# Patient Record
Sex: Male | Born: 1935
Health system: Southern US, Community
[De-identification: ages and names within clinical notes are randomized; demographics above are authoritative.]

## PROBLEM LIST (undated history)

## (undated) DIAGNOSIS — K219 Gastro-esophageal reflux disease without esophagitis: Secondary | ICD-10-CM

## (undated) DIAGNOSIS — M1990 Unspecified osteoarthritis, unspecified site: Secondary | ICD-10-CM

## (undated) DIAGNOSIS — R001 Bradycardia, unspecified: Secondary | ICD-10-CM

## (undated) HISTORY — PX: CATARACT EXTRACTION: SUR2

## (undated) HISTORY — PX: POLYPECTOMY: SHX149

## (undated) HISTORY — PX: OTHER SURGICAL HISTORY: SHX169

## (undated) HISTORY — DX: Unspecified osteoarthritis, unspecified site: M19.90

## (undated) HISTORY — PX: COLONOSCOPY: SHX174

## (undated) HISTORY — DX: Gastro-esophageal reflux disease without esophagitis: K21.9

---

## 2004-04-16 ENCOUNTER — Ambulatory Visit: Payer: Self-pay | Admitting: Gastroenterology

## 2004-05-01 ENCOUNTER — Ambulatory Visit: Payer: Self-pay | Admitting: Gastroenterology

## 2011-01-28 DIAGNOSIS — I471 Supraventricular tachycardia: Secondary | ICD-10-CM

## 2011-01-28 DIAGNOSIS — I4719 Other supraventricular tachycardia: Secondary | ICD-10-CM

## 2011-01-28 HISTORY — DX: Other supraventricular tachycardia: I47.19

## 2011-01-28 HISTORY — DX: Supraventricular tachycardia: I47.1

## 2011-02-10 DIAGNOSIS — H26499 Other secondary cataract, unspecified eye: Secondary | ICD-10-CM | POA: Diagnosis not present

## 2011-03-11 DIAGNOSIS — L821 Other seborrheic keratosis: Secondary | ICD-10-CM | POA: Diagnosis not present

## 2011-03-11 DIAGNOSIS — D239 Other benign neoplasm of skin, unspecified: Secondary | ICD-10-CM | POA: Diagnosis not present

## 2011-03-11 DIAGNOSIS — L570 Actinic keratosis: Secondary | ICD-10-CM | POA: Diagnosis not present

## 2011-04-17 DIAGNOSIS — M5030 Other cervical disc degeneration, unspecified cervical region: Secondary | ICD-10-CM | POA: Diagnosis not present

## 2011-04-17 DIAGNOSIS — M542 Cervicalgia: Secondary | ICD-10-CM | POA: Diagnosis not present

## 2011-04-18 DIAGNOSIS — M5030 Other cervical disc degeneration, unspecified cervical region: Secondary | ICD-10-CM | POA: Diagnosis not present

## 2011-04-18 DIAGNOSIS — M542 Cervicalgia: Secondary | ICD-10-CM | POA: Diagnosis not present

## 2011-04-18 DIAGNOSIS — M1990 Unspecified osteoarthritis, unspecified site: Secondary | ICD-10-CM | POA: Diagnosis not present

## 2011-04-22 DIAGNOSIS — M1990 Unspecified osteoarthritis, unspecified site: Secondary | ICD-10-CM | POA: Diagnosis not present

## 2011-04-22 DIAGNOSIS — M542 Cervicalgia: Secondary | ICD-10-CM | POA: Diagnosis not present

## 2011-04-22 DIAGNOSIS — M5030 Other cervical disc degeneration, unspecified cervical region: Secondary | ICD-10-CM | POA: Diagnosis not present

## 2011-04-24 DIAGNOSIS — M542 Cervicalgia: Secondary | ICD-10-CM | POA: Diagnosis not present

## 2011-04-24 DIAGNOSIS — M1990 Unspecified osteoarthritis, unspecified site: Secondary | ICD-10-CM | POA: Diagnosis not present

## 2011-04-24 DIAGNOSIS — L259 Unspecified contact dermatitis, unspecified cause: Secondary | ICD-10-CM | POA: Diagnosis not present

## 2011-04-24 DIAGNOSIS — M5030 Other cervical disc degeneration, unspecified cervical region: Secondary | ICD-10-CM | POA: Diagnosis not present

## 2011-04-29 DIAGNOSIS — M5030 Other cervical disc degeneration, unspecified cervical region: Secondary | ICD-10-CM | POA: Diagnosis not present

## 2011-04-29 DIAGNOSIS — M542 Cervicalgia: Secondary | ICD-10-CM | POA: Diagnosis not present

## 2011-04-29 DIAGNOSIS — M1990 Unspecified osteoarthritis, unspecified site: Secondary | ICD-10-CM | POA: Diagnosis not present

## 2011-05-01 DIAGNOSIS — M5030 Other cervical disc degeneration, unspecified cervical region: Secondary | ICD-10-CM | POA: Diagnosis not present

## 2011-05-01 DIAGNOSIS — M542 Cervicalgia: Secondary | ICD-10-CM | POA: Diagnosis not present

## 2011-05-01 DIAGNOSIS — M1990 Unspecified osteoarthritis, unspecified site: Secondary | ICD-10-CM | POA: Diagnosis not present

## 2011-05-06 DIAGNOSIS — M1990 Unspecified osteoarthritis, unspecified site: Secondary | ICD-10-CM | POA: Diagnosis not present

## 2011-05-06 DIAGNOSIS — M5030 Other cervical disc degeneration, unspecified cervical region: Secondary | ICD-10-CM | POA: Diagnosis not present

## 2011-05-06 DIAGNOSIS — M542 Cervicalgia: Secondary | ICD-10-CM | POA: Diagnosis not present

## 2011-05-08 DIAGNOSIS — M542 Cervicalgia: Secondary | ICD-10-CM | POA: Diagnosis not present

## 2011-05-08 DIAGNOSIS — M1990 Unspecified osteoarthritis, unspecified site: Secondary | ICD-10-CM | POA: Diagnosis not present

## 2011-05-08 DIAGNOSIS — M5030 Other cervical disc degeneration, unspecified cervical region: Secondary | ICD-10-CM | POA: Diagnosis not present

## 2011-05-14 DIAGNOSIS — H903 Sensorineural hearing loss, bilateral: Secondary | ICD-10-CM | POA: Diagnosis not present

## 2011-05-14 DIAGNOSIS — H9319 Tinnitus, unspecified ear: Secondary | ICD-10-CM | POA: Diagnosis not present

## 2011-06-24 DIAGNOSIS — Z125 Encounter for screening for malignant neoplasm of prostate: Secondary | ICD-10-CM | POA: Diagnosis not present

## 2011-06-24 DIAGNOSIS — R7309 Other abnormal glucose: Secondary | ICD-10-CM | POA: Diagnosis not present

## 2011-06-24 DIAGNOSIS — E785 Hyperlipidemia, unspecified: Secondary | ICD-10-CM | POA: Diagnosis not present

## 2011-06-27 DIAGNOSIS — N183 Chronic kidney disease, stage 3 unspecified: Secondary | ICD-10-CM | POA: Diagnosis not present

## 2011-06-27 DIAGNOSIS — Z0000 Encounter for general adult medical examination without abnormal findings: Secondary | ICD-10-CM | POA: Diagnosis not present

## 2011-06-27 DIAGNOSIS — M47812 Spondylosis without myelopathy or radiculopathy, cervical region: Secondary | ICD-10-CM | POA: Diagnosis not present

## 2011-06-27 DIAGNOSIS — E785 Hyperlipidemia, unspecified: Secondary | ICD-10-CM | POA: Diagnosis not present

## 2011-06-27 DIAGNOSIS — Z125 Encounter for screening for malignant neoplasm of prostate: Secondary | ICD-10-CM | POA: Diagnosis not present

## 2011-07-02 DIAGNOSIS — Z1212 Encounter for screening for malignant neoplasm of rectum: Secondary | ICD-10-CM | POA: Diagnosis not present

## 2011-07-14 ENCOUNTER — Encounter: Payer: Self-pay | Admitting: Gastroenterology

## 2011-08-06 ENCOUNTER — Ambulatory Visit (AMBULATORY_SURGERY_CENTER): Payer: Medicare Other | Admitting: *Deleted

## 2011-08-06 VITALS — Ht 70.0 in | Wt 175.0 lb

## 2011-08-06 DIAGNOSIS — Z86010 Personal history of colonic polyps: Secondary | ICD-10-CM

## 2011-08-06 DIAGNOSIS — Z1211 Encounter for screening for malignant neoplasm of colon: Secondary | ICD-10-CM

## 2011-08-06 MED ORDER — PEG-KCL-NACL-NASULF-NA ASC-C 100 G PO SOLR: ORAL | Status: DC

## 2011-08-06 NOTE — Progress Notes (Signed)
No allergy to eggs or soy products 

## 2011-08-20 ENCOUNTER — Encounter: Payer: Self-pay | Admitting: Gastroenterology

## 2011-08-20 ENCOUNTER — Ambulatory Visit (AMBULATORY_SURGERY_CENTER): Payer: Medicare Other | Admitting: Gastroenterology

## 2011-08-20 VITALS — BP 136/110 | HR 61 | Temp 96.3°F | Resp 18 | Ht 70.0 in | Wt 175.0 lb

## 2011-08-20 DIAGNOSIS — Z1211 Encounter for screening for malignant neoplasm of colon: Secondary | ICD-10-CM

## 2011-08-20 DIAGNOSIS — Z86010 Personal history of colonic polyps: Secondary | ICD-10-CM

## 2011-08-20 DIAGNOSIS — K219 Gastro-esophageal reflux disease without esophagitis: Secondary | ICD-10-CM | POA: Diagnosis not present

## 2011-08-20 MED ORDER — SODIUM CHLORIDE 0.9 % IV SOLN: 500.0000 mL | INTRAVENOUS | Status: DC

## 2011-08-20 NOTE — Patient Instructions (Addendum)
YOU HAD AN ENDOSCOPIC PROCEDURE TODAY AT THE [facility]: Refer to the procedure report that was given to you for any specific questions about what was found during the examination.  If the procedure report does not answer your questions, please call your gastroenterologist to clarify.  If you requested that your care partner not be given the details of your procedure findings, then the procedure report has been included in a sealed envelope for you to review at your convenience later.  YOU SHOULD EXPECT: Some feelings of bloating in the abdomen. Passage of more gas than usual.  Walking can help get rid of the air that was put into your GI tract during the procedure and reduce the bloating. If you had a lower endoscopy (such as a colonoscopy or flexible sigmoidoscopy) you may notice spotting of blood in your stool or on the toilet paper. If you underwent a bowel prep for your procedure, then you may not have a normal bowel movement for a few days.  DIET: Your first meal following the procedure should be a light meal and then it is ok to progress to your normal diet.  A half-sandwich or bowl of soup is an example of a good first meal.  Heavy or fried foods are harder to digest and may make you feel nauseous or bloated.  Likewise meals heavy in dairy and vegetables can cause extra gas to form and this can also increase the bloating.  Drink plenty of fluids but you should avoid alcoholic beverages for 24 hours.  ACTIVITY: Your care partner should take you home directly after the procedure.  You should plan to take it easy, moving slowly for the rest of the day.  You can resume normal activity the day after the procedure however you should NOT DRIVE or use heavy machinery for 24 hours (because of the sedation medicines used during the test).    SYMPTOMS TO REPORT IMMEDIATELY: A gastroenterologist can be reached at any hour.  During normal business hours, 8:30 AM to 5:00 PM Monday through Friday,  call [phone].  After hours and on weekends, please call the GI answering service at [phone] who will take a message and have the physician on call contact you.   Following lower endoscopy (colonoscopy or flexible sigmoidoscopy):  Excessive amounts of blood in the stool  Significant tenderness or worsening of abdominal pains  Swelling of the abdomen that is new, acute  Fever of 100F or higher  Following upper endoscopy (EGD)  Vomiting of blood or coffee ground material  New chest pain or pain under the shoulder blades  Painful or persistently difficult swallowing  New shortness of breath  Fever of 100F or higher  Black, tarry-looking stools  FOLLOW UP: If any biopsies were taken you will be contacted by phone or by letter within the next 1-3 weeks.  Call your gastroenterologist if you have not heard about the biopsies in 3 weeks.  Our staff will call the home number listed on your records the next business day following your procedure to check on you and address any questions or concerns that you may have at that time regarding the information given to you following your procedure. This is a courtesy call and so if there is no answer at the home number and we have not heard from you through the emergency physician on call, we will assume that you have returned to your regular daily activities without incident.  SIGNATURES/CONFIDENTIALITY: You and/or your care   partner have signed paperwork which will be entered into your electronic medical record.  These signatures attest to the fact that that the information above on your After Visit Summary has been reviewed and is understood.  Full responsibility of the confidentiality of this discharge information lies with you and/or your care-partner.  

## 2011-08-20 NOTE — Progress Notes (Signed)
Patient did not experience any of the following events: a burn prior to discharge; a fall within the facility; wrong site/side/patient/procedure/implant event; or a hospital transfer or hospital admission upon discharge from the facility. (G8907) Patient did not have preoperative order for IV antibiotic SSI prophylaxis. (G8918)  

## 2011-08-20 NOTE — Op Note (Signed)
[facility] [address] [place], [place]  [address]  COLONOSCOPY PROCEDURE REPORT  PATIENT:  Jaime Gates, Jaime Gates  MR#:  [id number] BIRTHDATE:  08/16/1931, 79 yrs. old  GENDER:  male ENDOSCOPIST:  Vania Rea. Jarold Motto, MD, [facility] REF. BY: PROCEDURE DATE:  08/20/2011 PROCEDURE:  Diagnostic Colonoscopy ASA CLASS:  Class II INDICATIONS:  history of pre-cancerous (adenomatous) colon polyps  MEDICATIONS:   propofol (Diprivan) 250 mg IV  DESCRIPTION OF PROCEDURE:   After the risks and benefits and of the procedure were explained, informed consent was obtained. Digital rectal exam was performed and revealed no abnormalities. The LB CF-[id number] endoscope was introduced through the anus and advanced to the cecum, which was identified by both the appendix and ileocecal valve.  The quality of the prep was poor, using MoviPrep.  The instrument was then slowly withdrawn as the colon was fully examined. <<PROCEDUREIMAGES>>  FINDINGS:  No polyps or cancers were seen.  This was otherwise a normal examination of the colon. tortuous colon and poor prep !!! Retroflexed views in the rectum revealed no abnormalities.    The scope was then withdrawn from the patient and the procedure completed.  COMPLICATIONS:  None ENDOSCOPIC IMPRESSION: 1) No polyps or cancers 2) Otherwise normal examination RECOMMENDATIONS: 1) Continue current medications f/u prn only.  REPEAT EXAM:  No  ______________________________ Vania Rea. Jarold Motto, MD, [facility]  CC:  Jarome Matin, MD  n. Rosalie Doctor:   Vania Rea. Patterson at 08/20/2011 09:52 AM  Howard Pouch, [phone]

## 2011-08-21 ENCOUNTER — Telehealth: Payer: Self-pay

## 2011-08-21 NOTE — Telephone Encounter (Signed)
  Follow up Call-  Call back number 08/20/2011  Post procedure Call Back phone  # [phone]  Permission to leave phone message Yes     Patient questions:  Do you have a fever, pain , or abdominal swelling? no Pain Score  0 *  Have you tolerated food without any problems? yes  Have you been able to return to your normal activities? yes  Do you have any questions about your discharge instructions: Diet   no Medications  no Follow up visit  no  Do you have questions or concerns about your Care? no  Actions: * If pain score is 4 or above: No action needed, pain <4.  Per the pt, "I did good all the way around". Maw

## 2011-12-05 ENCOUNTER — Encounter: Payer: Self-pay | Admitting: Cardiovascular Disease

## 2011-12-05 DIAGNOSIS — R0602 Shortness of breath: Secondary | ICD-10-CM | POA: Diagnosis not present

## 2011-12-05 DIAGNOSIS — M47812 Spondylosis without myelopathy or radiculopathy, cervical region: Secondary | ICD-10-CM | POA: Diagnosis not present

## 2011-12-05 DIAGNOSIS — R42 Dizziness and giddiness: Secondary | ICD-10-CM | POA: Diagnosis not present

## 2011-12-08 ENCOUNTER — Encounter: Payer: Self-pay | Admitting: Cardiovascular Disease

## 2011-12-08 ENCOUNTER — Encounter: Payer: Self-pay | Admitting: *Deleted

## 2011-12-08 DIAGNOSIS — K219 Gastro-esophageal reflux disease without esophagitis: Secondary | ICD-10-CM | POA: Insufficient documentation

## 2011-12-08 DIAGNOSIS — M1990 Unspecified osteoarthritis, unspecified site: Secondary | ICD-10-CM | POA: Insufficient documentation

## 2011-12-10 ENCOUNTER — Ambulatory Visit (INDEPENDENT_AMBULATORY_CARE_PROVIDER_SITE_OTHER): Payer: Medicare Other | Admitting: Cardiovascular Disease

## 2011-12-10 ENCOUNTER — Encounter: Payer: Self-pay | Admitting: Cardiovascular Disease

## 2011-12-10 VITALS — BP 131/74 | HR 71 | Resp 18 | Ht 70.0 in | Wt 179.0 lb

## 2011-12-10 DIAGNOSIS — R0989 Other specified symptoms and signs involving the circulatory and respiratory systems: Secondary | ICD-10-CM

## 2011-12-10 DIAGNOSIS — R0609 Other forms of dyspnea: Secondary | ICD-10-CM | POA: Diagnosis not present

## 2011-12-10 DIAGNOSIS — R0602 Shortness of breath: Secondary | ICD-10-CM | POA: Diagnosis not present

## 2011-12-10 DIAGNOSIS — R0600 Dyspnea, unspecified: Secondary | ICD-10-CM

## 2011-12-10 DIAGNOSIS — R42 Dizziness and giddiness: Secondary | ICD-10-CM | POA: Diagnosis not present

## 2011-12-10 NOTE — Assessment & Plan Note (Signed)
No carotid bruits.  Will see what hemodynamic response to exercise is.  If cardiac w/u unreveiling may benefit from MRA of carotids and intracranial vascular system

## 2011-12-10 NOTE — Progress Notes (Signed)
Patient ID: Jaime Gates, male   DOB: 1931-08-28, 78 y.o.   MRN: [id number] 78 yo referred by Dr Jarold Motto for exertional dyspnea and dizzyness.  Last seen by Dr Rowe Robert in 97 with normal myovue and EF with EF 55% mild AV sclerosis.  Has had increasing dyspnea for 6-8 weeks.  I called Dr Jarold Motto and discussed w/u to date.  Has had normal CXR and spirometry.  No wheezing.  When he walks he gets dizzy then gets dyspnea.  No chest pain.   Episodes are steriotypical and reporducable though and always with exertion.  No resting symptoms No cough sputum wheezing or syncope. Symptoms are not postural and he was not postural in our office today.  NO evidence of volume overload or CHF.  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    ROS: Denies fever, malais, weight loss, blurry vision, decreased visual acuity, cough, sputum, SOB, hemoptysis, pleuritic pain, palpitaitons, heartburn, abdominal pain, melena, lower extremity edema, claudication, or rash.  All other systems reviewed and negative   General: Affect appropriate Healthy:  appears stated age HEENT: normal Neck supple with no adenopathy JVP normal no bruits no thyromegaly Lungs clear with no wheezing and good diaphragmatic motion Heart:  S1/S2 no murmur,rub, gallop or click PMI normal Abdomen: benighn, BS positve, no tenderness, no AAA no bruit.  No HSM or HJR Distal pulses intact with no bruits No edema Neuro non-focal Skin warm and dry No muscular weakness  Medications Current Outpatient Prescriptions  Medication Sig Dispense Refill  . aspirin 81 MG tablet Take 81 mg by mouth daily.      Marland Kitchen esomeprazole (NEXIUM) 40 MG capsule Take 40 mg by mouth as needed.      . Sildenafil Citrate (VIAGRA PO) Take by mouth. As needed        Allergies Fluroxene  Family History: Family History  Problem Relation Age of Onset  . Colon cancer Neg Hx   . Esophageal cancer Neg Hx   . Rectal  cancer Neg Hx   . Stomach cancer Neg Hx     Social History: History   Social History  . Marital Status: Married    Spouse Name: N/A    Number of Children: N/A  . Years of Education: N/A   Occupational History  . Not on file.   Social History Main Topics  . Smoking status: Never Smoker   . Smokeless tobacco: Never Used  . Alcohol Use: 4.2 oz/week    7 Glasses of wine per week  . Drug Use: No  . Sexually Active:    Other Topics Concern  . Not on file   Social History Narrative  . No narrative on file    Electrocardiogram:  12/05/11  SR rate 49  QT 479 normal  Assessment and Plan

## 2011-12-10 NOTE — Patient Instructions (Addendum)
Your physician recommends that you schedule a follow-up appointment in: AFTER TESTS  SEE DR Eden Emms SAME DAY AS ECHO Your physician recommends that you continue on your current medications as directed. Please refer to the Current Medication list given to you today.  Your physician has requested that you have an echocardiogram. Echocardiography is a painless test that uses sound waves to create images of your heart. It provides your doctor with information about the size and shape of your heart and how well your heart's chambers and valves are working. This procedure takes approximately one hour. There are no restrictions for this procedure. DX DYSPNEA DIZZINESS  SEE DR Eden Emms SAME DAY  Your physician has requested that you have en exercise stress myoview. For further information please visit [email or web address] Please follow instruction sheet, as given. DX  SHORTNESS OF BREATH

## 2011-12-10 NOTE — Assessment & Plan Note (Signed)
Etiology not clear.  Lung exam sounds distant  May need f/u high res CT and diffusion capacity.  Given exertional nature of symptoms and reproducability will get stress myovue  Also echo to assess RV/LV function R/O pulmonary hypertension.

## 2011-12-18 ENCOUNTER — Encounter: Payer: Self-pay | Admitting: Cardiovascular Disease

## 2011-12-18 ENCOUNTER — Encounter: Payer: Self-pay | Admitting: *Deleted

## 2011-12-18 ENCOUNTER — Ambulatory Visit (HOSPITAL_COMMUNITY): Payer: Medicare Other | Attending: Internal Medicine | Admitting: Radiology

## 2011-12-18 ENCOUNTER — Ambulatory Visit (INDEPENDENT_AMBULATORY_CARE_PROVIDER_SITE_OTHER): Payer: Medicare Other | Admitting: Cardiovascular Disease

## 2011-12-18 VITALS — BP 113/84 | HR 38 | Ht 70.0 in | Wt 170.0 lb

## 2011-12-18 VITALS — BP 130/80 | HR 52 | Resp 11

## 2011-12-18 DIAGNOSIS — R079 Chest pain, unspecified: Secondary | ICD-10-CM | POA: Diagnosis not present

## 2011-12-18 DIAGNOSIS — I471 Supraventricular tachycardia, unspecified: Secondary | ICD-10-CM

## 2011-12-18 DIAGNOSIS — Z01812 Encounter for preprocedural laboratory examination: Secondary | ICD-10-CM | POA: Diagnosis not present

## 2011-12-18 DIAGNOSIS — R0609 Other forms of dyspnea: Secondary | ICD-10-CM | POA: Diagnosis not present

## 2011-12-18 DIAGNOSIS — R0600 Dyspnea, unspecified: Secondary | ICD-10-CM

## 2011-12-18 DIAGNOSIS — R9439 Abnormal result of other cardiovascular function study: Secondary | ICD-10-CM | POA: Insufficient documentation

## 2011-12-18 DIAGNOSIS — I209 Angina pectoris, unspecified: Secondary | ICD-10-CM

## 2011-12-18 DIAGNOSIS — R0602 Shortness of breath: Secondary | ICD-10-CM | POA: Diagnosis not present

## 2011-12-18 DIAGNOSIS — R42 Dizziness and giddiness: Secondary | ICD-10-CM | POA: Diagnosis not present

## 2011-12-18 DIAGNOSIS — I4949 Other premature depolarization: Secondary | ICD-10-CM | POA: Diagnosis not present

## 2011-12-18 DIAGNOSIS — I498 Other specified cardiac arrhythmias: Secondary | ICD-10-CM

## 2011-12-18 DIAGNOSIS — R0989 Other specified symptoms and signs involving the circulatory and respiratory systems: Secondary | ICD-10-CM

## 2011-12-18 LAB — BASIC METABOLIC PANEL
Chloride: 104 mEq/L (ref 96–112)
GFR: 67.35 mL/min (ref >60.00)
Potassium: 4.3 mEq/L (ref 3.5–5.1)

## 2011-12-18 LAB — PROTIME-INR
INR: 1 ratio (ref 0.8–1.0)
Prothrombin Time: 10.7 s (ref 10.2–12.4)

## 2011-12-18 LAB — CBC WITH DIFFERENTIAL/PLATELET
Basophils Absolute: 0.1 10*3/uL (ref 0.0–0.1)
Eosinophils Absolute: 0.1 10*3/uL (ref 0.0–0.7)
Lymphocytes Relative: 34 % (ref 12.0–46.0)
MCHC: 33 g/dL (ref 30.0–36.0)
Neutrophils Relative %: 51.9 % (ref 43.0–77.0)
Platelets: 174 10*3/uL (ref 150.0–400.0)
RBC: 5.32 Mil/uL (ref 4.22–5.81)
RDW: 13.2 % (ref 11.5–14.6)

## 2011-12-18 MED ORDER — TECHNETIUM TC 99M SESTAMIBI GENERIC - CARDIOLITE
9.3000 | Freq: Once | INTRAVENOUS | Status: AC | PRN
  Administered 2011-12-18: 9 via INTRAVENOUS

## 2011-12-18 MED ORDER — TECHNETIUM TC 99M SESTAMIBI GENERIC - CARDIOLITE
33.0000 | Freq: Once | INTRAVENOUS | Status: AC | PRN
  Administered 2011-12-18: 33 via INTRAVENOUS

## 2011-12-18 NOTE — Progress Notes (Signed)
Patient ID: Jaime Gates, male   DOB: 12/11/1931, 78 y.o.   MRN: [id number] 78 yo referred by Dr Jarold Motto for exertional dyspnea and dizzyness. Last seen by Dr Rowe Robert in 97 with normal myovue and EF with EF 55% mild AV sclerosis. Has had increasing dyspnea for 6-8 weeks. I called Dr Jarold Motto and discussed w/u to date. Has had normal CXR and spirometry. No wheezing. When he walks he gets dizzy then gets dyspnea. No chest pain. Episodes are steriotypical and reporducable though and always with exertion. No resting symptoms No cough sputum wheezing or syncope. Symptoms are not postural and he was not postural in our office today. NO evidence of volume overload or CHF. 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   Reviewed stress myovue today.  Nuclear images with inferobasal thinning no ischemia but diffuse hypokinesis with EF 47% ECG markedly abnormal.  Started with friequent PAC and then frequent PVC;s  Sudden onset of rapid atrial tachycardia or flutter At rate of 160 bpm.  Resting HR was only 38  With tachycardia there was marked 3 mm of ST segment depression.  Patient did  Have some reproduction of his dyspnea and lightheadedness during the tachycardia.  ROS: Denies fever, malais, weight loss, blurry vision, decreased visual acuity, cough, sputum, SOB, hemoptysis, pleuritic pain, palpitaitons, heartburn, abdominal pain, melena, lower extremity edema, claudication, or rash.  All other systems reviewed and negative  General: Affect appropriate Healthy:  appears stated age HEENT: normal Neck supple with no adenopathy JVP normal no bruits no thyromegaly Lungs clear with no wheezing and good diaphragmatic motion Heart:  S1/S2 no murmur, no rub, gallop or click PMI normal Abdomen: benighn, BS positve, no tenderness, no AAA no bruit.  No HSM or HJR Distal pulses intact with no bruits No edema Neuro non-focal Skin warm and dry No muscular  weakness   Current Outpatient Prescriptions  Medication Sig Dispense Refill  . aspirin 81 MG tablet Take 81 mg by mouth daily.      Marland Kitchen esomeprazole (NEXIUM) 40 MG capsule Take 40 mg by mouth as needed.      . Sildenafil Citrate (VIAGRA PO) Take by mouth. As needed       No current facility-administered medications for this visit.   Facility-Administered Medications Ordered in Other Visits  Medication Dose Route Frequency Provider Last Rate Last Dose  . [COMPLETED] technetium sestamibi generic (CARDIOLITE) injection 33 milli Curie  33 milli Curie Intravenous Once PRN Pricilla Riffle, MD   33 milli Curie at 12/18/11 0940  . [COMPLETED] technetium sestamibi generic (CARDIOLITE) injection 9 milli Curie  9 milli Curie Intravenous Once PRN Pricilla Riffle, MD   9 milli Curie at 12/18/11 0800    Allergies  Fluroxene  Electrocardiogram:  Baseline SR rate 38 normal  Assessment and Plan

## 2011-12-18 NOTE — Progress Notes (Signed)
[facility] SITE 3 NUCLEAR MED [id number] [place] [address] [phone]  Cardiology Nuclear Med Study  Jaime Gates is a 78 y.o. male     MRN : [id number]     DOB: 1931-06-04  Procedure Date: 12/18/2011  Nuclear Med Background Indication for Stress Test:  Evaluation for Ischemia History:  '97  GEX:BMWUXL, EF=55% Cardiac Risk Factors: No cardiac risk factors.  Symptoms:  Dizziness with Exertion, DOE  and Tingling in Right Side of Neck and Jaw (Last episode of discomfort was while on his way over here)   Nuclear Pre-Procedure Caffeine/Decaff Intake:  None > 12 hrs NPO After: 7:30pm   Lungs:  Clear. O2 Sat: 93% on room air. IV 0.9% NS with Angio Cath:  20g  IV Site: R Antecubital x 1, tolerated well IV Started by:  Irean Hong, RN  Chest Size (in):  42 Cup Size: n/a  Height: 5\' 10"  (1.778 m)  Weight:  170 lb (77.111 kg)  BMI:  Body mass index is 24.39 kg/(m^2). Tech Comments:  No medications today    Nuclear Med Study 1 or 2 day study: 1 day  Stress Test Type:  Stress  Reading MD: Dietrich Pates, MD  Order Authorizing Provider:  Charlton Haws, MD  Resting Radionuclide: Technetium 67m Sestamibi  Resting Radionuclide Dose: 9.3 mCi   Stress Radionuclide:  Technetium 37m Sestamibi  Stress Radionuclide Dose: 30.1 mCi           Stress Protocol Rest HR: 38 Stress HR: 133/200 with SVT  Rest BP: 113/84 Stress BP: 167/90  Exercise Time (min): 7:30 METS: 9.3   Predicted Max HR: 142 bpm % Max HR: 93.66 bpm Rate Pressure Product: 24401   Dose of Adenosine (mg):  n/a Dose of Lexiscan: n/a mg  Dose of Atropine (mg): n/a Dose of Dobutamine: n/a mcg/kg/min (at max HR)  Stress Test Technologist: Smiley Houseman, CMA-N  Nuclear Technologist:  Domenic Polite, CNMT     Rest Procedure:  Myocardial perfusion imaging was performed at rest 45 minutes following the intravenous administration of Technetium 60m Sestamibi.  Rest ECG: Sinus bradycardia  38 bpm.    Stress Procedure:  The patient exercised on the treadmill utilizing the Bruce protocol for 7:30 minutes. He then stopped due to dyspnea and denied any chest pain.  There were marked ST-T wave changes at peak exercise when patient was in SVT with a rate of 200.  There were frequent PVC's with couplets and occasional PAC's noted.  Technetium 73m Sestamibi was injected at peak exercise and myocardial perfusion imaging was performed after a brief delay.  EKG's and images were discussed with Dr. Eden Emms and patient was seen at this time.  Stress ECG: ST depression consistent with ischemia during SVT.  QPS Raw Data Images:  Images were motion corrected.  Soft tissue (diaphragm, bowel) underlie heart. Stress Images:  LV is dilated.  Thinning with decreased tracer counts in the inferior wall (base, mid); inferoseptal wall (base, mid).  Otherwise normal perfusion. Rest Images:  Comparison with the stress images reveals no significant change. Subtraction (SDS):  No evidence of ischemia. Transient Ischemic Dilatation (Normal <1.22):  1.00 Lung/Heart Ratio (Normal <0.45):  0.31  Quantitative Gated Spect Images QGS EDV:  127 ml QGS ESV:  68 ml  Impression Exercise Capacity:  Good exercise capacity. BP Response:  Normal blood pressure response. Clinical Symptoms:  No chest pain. ECG Impression:  Frequent PVCs and couplets during exercise. In recovery period patient developed  SVT for 10 sec (173 bpm) with ST depression  (2 mm in the lateral leads).  Normalized with conversion to SR.  Comparison with Prior Nuclear Study: Previous report was normal. (1997)  Overall Impression:  Inferior/inferoseptal changes consistent with scar and possible soft tissue attenuation with minimal periinfarct ischemia.  LV Ejection Fraction: 47%.  LV Wall Motion:  Inferior hypokinesis.  Dietrich Pates

## 2011-12-18 NOTE — Patient Instructions (Addendum)
Your physician has requested that you have a cardiac catheterization. Cardiac catheterization is used to diagnose and/or treat various heart conditions. Doctors may recommend this procedure for a number of different reasons. The most common reason is to evaluate chest pain. Chest pain can be a symptom of coronary artery disease (CAD), and cardiac catheterization can show whether plaque is narrowing or blocking your heart's arteries. This procedure is also used to evaluate the valves, as well as measure the blood flow and oxygen levels in different parts of your heart. For further information please visit [email or web address]. Please follow instruction sheet, as given.  Lab work today  

## 2011-12-18 NOTE — Assessment & Plan Note (Signed)
Reviewed strips with Dr Johney Frame and EP.  Not clear if this is flutter or more likely atrial tachycardia.  Pending EF eval and R/O CAD will f/u with EP to discusse mapping and ablation.  Will not start on beta blocker due to low resting HR

## 2011-12-18 NOTE — Assessment & Plan Note (Signed)
Given stress test may be related to CAD, DCM or arrhythmia  Pending results of cath may need further quantification of EF

## 2011-12-18 NOTE — Assessment & Plan Note (Signed)
With replication of dizzyness and dyspnea  Given his hobby of racing cars and ST depression will refer to [facility] lab for right and left heart cath. Risks and procedure discussed Willing to proceed Lab work and orders done

## 2011-12-19 ENCOUNTER — Inpatient Hospital Stay (HOSPITAL_BASED_OUTPATIENT_CLINIC_OR_DEPARTMENT_OTHER)
Admission: RE | Admit: 2011-12-19 | Discharge: 2011-12-19 | Disposition: A | Discharge status: Home / Self Care | Payer: Medicare Other | Source: Ambulatory Visit | Attending: Cardiology | Admitting: Cardiology

## 2011-12-19 ENCOUNTER — Encounter (HOSPITAL_BASED_OUTPATIENT_CLINIC_OR_DEPARTMENT_OTHER)
Admission: RE | Disposition: A | Discharge status: Home / Self Care | Payer: Self-pay | Source: Ambulatory Visit | Attending: Cardiology

## 2011-12-19 DIAGNOSIS — R42 Dizziness and giddiness: Secondary | ICD-10-CM | POA: Diagnosis not present

## 2011-12-19 DIAGNOSIS — R0989 Other specified symptoms and signs involving the circulatory and respiratory systems: Secondary | ICD-10-CM | POA: Insufficient documentation

## 2011-12-19 DIAGNOSIS — R0609 Other forms of dyspnea: Secondary | ICD-10-CM | POA: Diagnosis not present

## 2011-12-19 DIAGNOSIS — I519 Heart disease, unspecified: Secondary | ICD-10-CM | POA: Insufficient documentation

## 2011-12-19 DIAGNOSIS — I2510 Atherosclerotic heart disease of native coronary artery without angina pectoris: Secondary | ICD-10-CM | POA: Insufficient documentation

## 2011-12-19 LAB — POCT I-STAT 3, VENOUS BLOOD GAS (G3P V)
Acid-base deficit: 2 mmol/L (ref 0.0–2.0)
O2 Saturation: 70 %
TCO2: 26 mmol/L (ref 0–100)
pH, Ven: 7.347 — ABNORMAL HIGH (ref 7.250–7.300)

## 2011-12-19 LAB — POCT I-STAT 3, ART BLOOD GAS (G3+)
Bicarbonate: 25 mEq/L — ABNORMAL HIGH (ref 20.0–24.0)
TCO2: 26 mmol/L (ref 0–100)
pH, Arterial: 7.383 (ref 7.350–7.450)
pO2, Arterial: 71 mmHg — ABNORMAL LOW (ref 80.0–100.0)

## 2011-12-19 SURGERY — JV LEFT AND RIGHT HEART CATHETERIZATION WITH CORONARY ANGIOGRAM
Anesthesia: Moderate Sedation

## 2011-12-19 MED ORDER — SODIUM CHLORIDE 0.9 % IV SOLN
INTRAVENOUS | Status: DC
  Administered 2011-12-19: 13:00:00 via INTRAVENOUS

## 2011-12-19 MED ORDER — ACETAMINOPHEN 325 MG PO TABS: 650.0000 mg | ORAL_TABLET | ORAL | Status: DC | PRN

## 2011-12-19 MED ORDER — ONDANSETRON HCL 4 MG/2ML IJ SOLN: 4.0000 mg | Freq: Four times a day (QID) | INTRAMUSCULAR | Status: DC | PRN

## 2011-12-19 MED ORDER — SODIUM CHLORIDE 0.9 % IV SOLN: 1.0000 mL/kg/h | INTRAVENOUS | Status: DC

## 2011-12-19 NOTE — OR Nursing (Signed)
Discharge instructions reviewed and signed, pt stated understanding, ambulated in hall without difficulty, site level 0, transported to wife's car via wheelchair 

## 2011-12-19 NOTE — Progress Notes (Signed)
Bedrest begins @ 1410, tegaderm dressing applied to right groin site by Army Melia, site level 0.

## 2011-12-19 NOTE — Interval H&P Note (Signed)
History and Physical Interval Note:  12/19/2011 1:18 PM  Jaime Gates  has presented today for surgery, with the diagnosis of chest pain  The various methods of treatment have been discussed with the patient and family. After consideration of risks, benefits and other options for treatment, the patient has consented to  Procedure(s) (LRB) with comments: [facility] LEFT AND RIGHT HEART CATHETERIZATION WITH CORONARY ANGIOGRAM (N/A) as a surgical intervention .  The patient's history has been reviewed, patient examined, no change in status, stable for surgery.  I have reviewed the patient's chart and labs.  Questions were answered to the patient's satisfaction.     Theron Arista [facility] 12/19/2011 1:18 PM

## 2011-12-19 NOTE — H&P (View-Only) (Signed)
Patient ID: Jaime Gates, male   DOB: 01/16/1932, 78 y.o.   MRN: [id number] 78 yo referred by Dr Patterson for exertional dyspnea and dizzyness. Last seen by Dr Grodecki in 97 with normal myovue and EF with EF 55% mild AV sclerosis. Has had increasing dyspnea for 6-8 weeks. I called Dr Patterson and discussed w/u to date. Has had normal CXR and spirometry. No wheezing. When he walks he gets dizzy then gets dyspnea. No chest pain. Episodes are steriotypical and reporducable though and always with exertion. No resting symptoms No cough sputum wheezing or syncope. Symptoms are not postural and he was not postural in our office today. NO evidence of volume overload or CHF. 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   Reviewed stress myovue today.  Nuclear images with inferobasal thinning no ischemia but diffuse hypokinesis with EF 47% ECG markedly abnormal.  Started with friequent PAC and then frequent PVC;s  Sudden onset of rapid atrial tachycardia or flutter At rate of 160 bpm.  Resting HR was only 38  With tachycardia there was marked 3 mm of ST segment depression.  Patient did  Have some reproduction of his dyspnea and lightheadedness during the tachycardia.  ROS: Denies fever, malais, weight loss, blurry vision, decreased visual acuity, cough, sputum, SOB, hemoptysis, pleuritic pain, palpitaitons, heartburn, abdominal pain, melena, lower extremity edema, claudication, or rash.  All other systems reviewed and negative  General: Affect appropriate Healthy:  appears stated age HEENT: normal Neck supple with no adenopathy JVP normal no bruits no thyromegaly Lungs clear with no wheezing and good diaphragmatic motion Heart:  S1/S2 no murmur, no rub, gallop or click PMI normal Abdomen: benighn, BS positve, no tenderness, no AAA no bruit.  No HSM or HJR Distal pulses intact with no bruits No edema Neuro non-focal Skin warm and dry No muscular  weakness   Current Outpatient Prescriptions  Medication Sig Dispense Refill  . aspirin 81 MG tablet Take 81 mg by mouth daily.      . esomeprazole (NEXIUM) 40 MG capsule Take 40 mg by mouth as needed.      . Sildenafil Citrate (VIAGRA PO) Take by mouth. As needed       No current facility-administered medications for this visit.   Facility-Administered Medications Ordered in Other Visits  Medication Dose Route Frequency Provider Last Rate Last Dose  . [COMPLETED] technetium sestamibi generic (CARDIOLITE) injection 33 milli Curie  33 milli Curie Intravenous Once PRN Paula V Ross, MD   33 milli Curie at 12/18/11 0940  . [COMPLETED] technetium sestamibi generic (CARDIOLITE) injection 9 milli Curie  9 milli Curie Intravenous Once PRN Paula V Ross, MD   9 milli Curie at 12/18/11 0800    Allergies  Fluroxene  Electrocardiogram:  Baseline SR rate 38 normal  Assessment and Plan  

## 2011-12-19 NOTE — CV Procedure (Signed)
   Cardiac Catheterization Procedure Note  Name: Jaime Gates MRN: [id number] DOB: 1931/10/26  Procedure: Right Heart Cath, Left Heart Cath, Selective Coronary Angiography, LV angiography  Indication: 78 year old white male presents with predominant symptoms of dyspnea on exertion. Stress Myoview study is abnormal by ECG criteria. Perfusion imaging was normal. Ejection fraction was reduced to 47%.   Procedural Details: The right groin was prepped, draped, and anesthetized with 1% lidocaine. Using the modified Seldinger technique a 4 French sheath was placed in the right femoral artery and a 7 French sheath was placed in the right femoral vein. A Swan-Ganz catheter was used for the right heart catheterization. Standard protocol was followed for recording of right heart pressures and sampling of oxygen saturations. Fick cardiac output was calculated. Standard Judkins catheters were used for selective coronary angiography and left ventriculography. There were no immediate procedural complications. The patient was transferred to the post catheterization recovery area for further monitoring.  Procedural Findings: Hemodynamics RA 9/6 with a mean of 5 mmHg RV 28/4 mmHg PA 29/10 with a mean of 16 mmHg PCWP 11/10 with a mean of 8 mmHg LV 110/13 mmHg AO 107/60 with a mean of 79 mmHg  Oxygen saturations: PA 94% AO 70%  Cardiac Output (Fick) 4.4 L per minute  Cardiac Index (Fick) 2.3 L per minute per meter square   Coronary angiography: Coronary dominance: Left  Left mainstem: The left main is short and without significant disease.  Left anterior descending (LAD): The LAD has minor irregularities. The first diagonal branch has 40-50% stenosis proximally.  Left circumflex (LCx): The left circumflex is a dominant vessel. There is 20-30% focal disease at the ostium. The mid to distal vessel is diffusely ectatic.  The ramus intermediate branch is moderate in size and without significant  disease.  Right coronary artery (RCA): Small and nondominant. There is 50% narrowing at the ostium.  Left ventriculography: Left ventricular systolic function is abnormal, LVEF is estimated at 40 %, there is global hypokinesis, there is no significant mitral regurgitation. The aortic root is mildly dilated.  Final Conclusions:   1. Nonobstructive coronary disease. 2. Moderate left ventricular dysfunction. 3. Normal right heart pressures.  Recommendations: Medical management.   Theron Arista [facility] 12/19/2011, 1:56 PM

## 2011-12-22 ENCOUNTER — Other Ambulatory Visit (HOSPITAL_COMMUNITY): Payer: Medicare Other

## 2011-12-22 ENCOUNTER — Ambulatory Visit: Payer: Medicare Other | Admitting: Cardiovascular Disease

## 2011-12-30 ENCOUNTER — Encounter: Payer: Self-pay | Admitting: Internal Medicine

## 2011-12-30 ENCOUNTER — Ambulatory Visit (INDEPENDENT_AMBULATORY_CARE_PROVIDER_SITE_OTHER): Payer: Medicare Other | Admitting: Internal Medicine

## 2011-12-30 VITALS — BP 112/68 | HR 56 | Ht 70.0 in | Wt 175.4 lb

## 2011-12-30 DIAGNOSIS — I498 Other specified cardiac arrhythmias: Secondary | ICD-10-CM

## 2011-12-30 DIAGNOSIS — I471 Supraventricular tachycardia: Secondary | ICD-10-CM

## 2011-12-30 DIAGNOSIS — R002 Palpitations: Secondary | ICD-10-CM | POA: Diagnosis not present

## 2011-12-30 NOTE — Progress Notes (Signed)
HPI   [name] is referred today by Dr. Eden Emms for evaluation of SVT versus atrial flutter. The patient is a very pleasant 78 year old man who does not have palpitations. He does note that he will have episodic periods where he feels weak. He underwent cardiopulmonary stress testing and was said to have SVT at 200 beats a minute. I've tried to obtain the stress and have been unsuccessful. The patient has never had syncope. He denies chest pain. His stress test was abnormal and he underwent catheterization which demonstrated nonobstructive coronary disease and mild left ventricular dysfunction, ejection fraction 45-50%. 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 Allergies  Allergen Reactions  . Fluroxene Hives     Current Outpatient Prescriptions  Medication Sig Dispense Refill  . aspirin 81 MG tablet Take 81 mg by mouth daily.      Marland Kitchen esomeprazole (NEXIUM) 40 MG capsule Take 40 mg by mouth as needed.      . Sildenafil Citrate (VIAGRA PO) Take by mouth. As needed         Past Medical History  Diagnosis Date  . GERD (gastroesophageal reflux disease)   . Arthritis     back    ROS:   All systems reviewed and negative except as noted in the HPI.   Past Surgical History  Procedure Date  . Colonoscopy   . Polypectomy   . Cataract extraction     both eyes  . Carpel tunnel     both wrists     Family History  Problem Relation Age of Onset  . Colon cancer Neg Hx   . Esophageal cancer Neg Hx   . Rectal cancer Neg Hx   . Stomach cancer Neg Hx      History   Social History  . Marital Status: Married    Spouse Name: N/A    Number of Children: N/A  . Years of Education: N/A   Occupational History  . Not on file.   Social History Main Topics  . Smoking status: Never Smoker   . Smokeless tobacco: Never Used  . Alcohol Use: 4.2 oz/week    7 Glasses of wine per week  . Drug Use: No  . Sexually Active:     Other Topics Concern  . Not on file   Social History Narrative  . No narrative on file     BP 112/68  Pulse 56  Ht 5\' 10"  (1.778 m)  Wt 175 lb 6.4 oz (79.561 kg)  BMI 25.17 kg/m2  Physical Exam:  Well appearing 78 year old man, NAD HEENT: Unremarkable Neck:  No JVD, no thyromegally Lungs:  Clear with no wheezes, rales, or rhonchi. HEART:  Regular rate rhythm, no murmurs, no rubs, no clicks Abd:  soft, positive bowel sounds, no organomegally, no rebound, no guarding Ext:  2 plus pulses, no edema, no cyanosis, no clubbing Skin:  No rashes no nodules Neuro:  CN II through XII intact, motor grossly intact  Assess/Plan:

## 2011-12-30 NOTE — Assessment & Plan Note (Signed)
The mechanism of his arrhythmia is still unclear. I've asked the patient to wear a cardiac monitor for 3 weeks. Hopefully we can better understand her mechanism of his SVT. He is not particularly symptomatic, and for this reason, I would not be aggressive with regard to recommending catheter ablation. I will plan to see the patient back in several weeks.

## 2011-12-30 NOTE — Patient Instructions (Signed)
Your physician recommends that you schedule a follow-up appointment in: 6 weeks Dr Ladona Ridgel   Your physician has recommended that you wear an event monitor. Event monitors are medical devices that record the heart's electrical activity. Doctors most often [place] these monitors to diagnose arrhythmias. Arrhythmias are problems with the speed or rhythm of the heartbeat. The monitor is a small, portable device. You can wear one while you do your normal daily activities. This is usually used to diagnose what is causing palpitations/syncope (passing out).

## 2012-01-02 ENCOUNTER — Telehealth (HOSPITAL_COMMUNITY): Payer: Self-pay | Admitting: *Deleted

## 2012-01-02 NOTE — Telephone Encounter (Signed)
Pt event monitor enrolled to be mailed on 01/02/12. tk

## 2012-01-06 ENCOUNTER — Encounter: Payer: Self-pay | Admitting: Internal Medicine

## 2012-01-06 DIAGNOSIS — R002 Palpitations: Secondary | ICD-10-CM | POA: Diagnosis not present

## 2012-01-10 ENCOUNTER — Encounter: Payer: Self-pay | Admitting: Cardiovascular Disease

## 2012-01-14 ENCOUNTER — Telehealth: Payer: Self-pay | Admitting: *Deleted

## 2012-01-14 NOTE — Telephone Encounter (Signed)
Call patient to confirmed whether he received the monitor by mail. Per Jaime Gates he is currently wearing the event monitor.

## 2012-01-15 NOTE — Telephone Encounter (Signed)
[employer] Manager spoke with patient.

## 2012-02-12 ENCOUNTER — Ambulatory Visit (INDEPENDENT_AMBULATORY_CARE_PROVIDER_SITE_OTHER): Payer: Medicare Other | Admitting: Internal Medicine

## 2012-02-12 ENCOUNTER — Encounter: Payer: Self-pay | Admitting: Internal Medicine

## 2012-02-12 VITALS — BP 129/69 | HR 62 | Ht 70.0 in | Wt 176.0 lb

## 2012-02-12 DIAGNOSIS — R002 Palpitations: Secondary | ICD-10-CM

## 2012-02-12 DIAGNOSIS — I5042 Chronic combined systolic (congestive) and diastolic (congestive) heart failure: Secondary | ICD-10-CM | POA: Diagnosis not present

## 2012-02-12 DIAGNOSIS — I471 Supraventricular tachycardia: Secondary | ICD-10-CM

## 2012-02-12 DIAGNOSIS — I498 Other specified cardiac arrhythmias: Secondary | ICD-10-CM

## 2012-02-12 MED ORDER — ENALAPRIL MALEATE 2.5 MG PO TABS: 2.5000 mg | ORAL_TABLET | Freq: Two times a day (BID) | ORAL | Status: DC

## 2012-02-12 MED ORDER — CARVEDILOL 3.125 MG PO TABS: 3.1250 mg | ORAL_TABLET | Freq: Two times a day (BID) | ORAL | Status: DC

## 2012-02-12 NOTE — Assessment & Plan Note (Signed)
His symptoms are very minimal. I've recommended that he continue watchful waiting. His cardiac monitor demonstrates PVCs in a bigeminal fashion. There also PACs noted. There were no sustained tachyarrhythmias and a three-week monitoring period.

## 2012-02-12 NOTE — Assessment & Plan Note (Signed)
His symptoms are really class 1-2. He has mild left ventricular dysfunction. I've recommended a low dose beta blocker and ACE inhibitor.

## 2012-02-12 NOTE — Patient Instructions (Addendum)
Your physician recommends that you schedule a follow-up appointment in: 3 months with Dr Ladona Ridgel  Your physician has recommended you make the following change in your medication:  1) Start Carvedilol 3.125mg  one by mouth twice daily 2) Start Enalapril 2.5mg  one by mouth twice daily

## 2012-02-12 NOTE — Progress Notes (Signed)
HPI   Jaime Gates returns today for followup. He is a very pleasant 79 year old man with recurrent tachycardia palpitations and history of rapid heartbeats with no documented sustained arrhythmias. In the interim, he has had palpitations and has worn a cardiac monitor which demonstrates sinus rhythm, sinus tachycardia, and fairly frequent PVCs. His cardiac monitor was notable for noise artifact making interpretation somewhat more difficult. Allergies  Allergen Reactions  . Fluroxene Hives     Current Outpatient Prescriptions  Medication Sig Dispense Refill  . aspirin 81 MG tablet Take 81 mg by mouth daily.      Marland Kitchen esomeprazole (NEXIUM) 40 MG capsule Take 40 mg by mouth as needed.      . Sildenafil Citrate (VIAGRA PO) Take by mouth. As needed      . carvedilol (COREG) 3.125 MG tablet Take 1 tablet (3.125 mg total) by mouth 2 (two) times daily with a meal.  60 tablet  6  . enalapril (VASOTEC) 2.5 MG tablet Take 1 tablet (2.5 mg total) by mouth 2 (two) times daily.  60 tablet  6     Past Medical History  Diagnosis Date  . GERD (gastroesophageal reflux disease)   . Arthritis     back    ROS:   All systems reviewed and negative except as noted in the HPI.   Past Surgical History  Procedure Date  . Colonoscopy   . Polypectomy   . Cataract extraction     both eyes  . Carpel tunnel     both wrists     Family History  Problem Relation Age of Onset  . Colon cancer Neg Hx   . Esophageal cancer Neg Hx   . Rectal cancer Neg Hx   . Stomach cancer Neg Hx      History   Social History  . Marital Status: Married    Spouse Name: N/A    Number of Children: N/A  . Years of Education: N/A   Occupational History  . Not on file.   Social History Main Topics  . Smoking status: Never Smoker   . Smokeless tobacco: Never Used  . Alcohol Use: 4.2 oz/week    7 Glasses of wine per week  . Drug Use: No  . Sexually Active:    Other Topics Concern  . Not on file   Social History  Narrative  . No narrative on file     BP 129/69  Pulse 62  Ht 5\' 10"  (1.778 m)  Wt 176 lb (79.833 kg)  BMI 25.25 kg/m2  Physical Exam:  Well appearing NAD HEENT: Unremarkable Neck:  7 cm JVD, no thyromegally Lungs:  Clear with no wheezes, rales, or rhonchi. HEART:  Regular rate rhythm, no murmurs, no rubs, no clicks Abd:  soft, positive bowel sounds, no organomegally, no rebound, no guarding Ext:  2 plus pulses, no edema, no cyanosis, no clubbing Skin:  No rashes no nodules Neuro:  CN II through XII intact, motor grossly intact   Assess/Plan:

## 2012-02-12 NOTE — Assessment & Plan Note (Signed)
Despite a stress test which reported heart rates of up to 200 beats a minute, which could not be obtained, he has had no documented atrial arrhythmias. He will undergo watchful waiting.

## 2012-03-09 ENCOUNTER — Encounter: Payer: Self-pay | Admitting: Cardiovascular Disease

## 2012-03-09 DIAGNOSIS — I5042 Chronic combined systolic (congestive) and diastolic (congestive) heart failure: Secondary | ICD-10-CM

## 2012-03-09 MED ORDER — CARVEDILOL 3.125 MG PO TABS: 3.1250 mg | ORAL_TABLET | Freq: Two times a day (BID) | ORAL | Status: DC

## 2012-03-09 MED ORDER — ENALAPRIL MALEATE 2.5 MG PO TABS: 2.5000 mg | ORAL_TABLET | Freq: Two times a day (BID) | ORAL | Status: DC

## 2012-03-09 NOTE — Telephone Encounter (Signed)
Refill request completed, pt notified

## 2012-03-13 ENCOUNTER — Other Ambulatory Visit: Payer: Self-pay

## 2012-03-22 ENCOUNTER — Other Ambulatory Visit: Payer: Self-pay | Admitting: Dermatology

## 2012-03-22 DIAGNOSIS — D2360 Other benign neoplasm of skin of unspecified upper limb, including shoulder: Secondary | ICD-10-CM | POA: Diagnosis not present

## 2012-03-22 DIAGNOSIS — L819 Disorder of pigmentation, unspecified: Secondary | ICD-10-CM | POA: Diagnosis not present

## 2012-03-22 DIAGNOSIS — D485 Neoplasm of uncertain behavior of skin: Secondary | ICD-10-CM | POA: Diagnosis not present

## 2012-03-22 DIAGNOSIS — L570 Actinic keratosis: Secondary | ICD-10-CM | POA: Diagnosis not present

## 2012-03-22 DIAGNOSIS — D239 Other benign neoplasm of skin, unspecified: Secondary | ICD-10-CM | POA: Diagnosis not present

## 2012-05-19 ENCOUNTER — Encounter: Payer: Self-pay | Admitting: Internal Medicine

## 2012-05-19 ENCOUNTER — Ambulatory Visit (INDEPENDENT_AMBULATORY_CARE_PROVIDER_SITE_OTHER): Payer: Medicare Other | Admitting: Internal Medicine

## 2012-05-19 VITALS — BP 120/70 | HR 72 | Ht 70.0 in | Wt 173.6 lb

## 2012-05-19 DIAGNOSIS — I498 Other specified cardiac arrhythmias: Secondary | ICD-10-CM

## 2012-05-19 DIAGNOSIS — I5042 Chronic combined systolic (congestive) and diastolic (congestive) heart failure: Secondary | ICD-10-CM | POA: Diagnosis not present

## 2012-05-19 DIAGNOSIS — R001 Bradycardia, unspecified: Secondary | ICD-10-CM

## 2012-05-19 DIAGNOSIS — R002 Palpitations: Secondary | ICD-10-CM | POA: Diagnosis not present

## 2012-05-19 NOTE — Assessment & Plan Note (Signed)
He brings in today a diary of his blood pressure and heart rate. For the most part, his heart rate is in the 50-55 beats per minute range. At times however his resting heart rate is in the 40s. I've asked the patient to undergo watchful waiting. I've given him warning signs that would suggest that his bradycardia is becoming symptomatic. I plan to see him back in 6 months and we will revisit the issue of whether or not his bradycardia is symptomatic.

## 2012-05-19 NOTE — Patient Instructions (Addendum)
Your physician wants you to follow-up in: 6 months with Dr Taylor You will receive a reminder letter in the mail two months in advance. If you don't receive a letter, please call our office to schedule the follow-up appointment.  

## 2012-05-19 NOTE — Assessment & Plan Note (Signed)
His symptoms are class II. He'll continue his current medical therapy. I've encouraged the patient to refrain from excessive caffeine, alcohol, or sodium.

## 2012-05-19 NOTE — Assessment & Plan Note (Signed)
His symptoms are fairly well controlled. He will continue his low dose beta blocker. At the current time, there is no indication for antiarrhythmic drug therapy.

## 2012-05-19 NOTE — Progress Notes (Signed)
HPI Jaime Gates returns today for followup. He is a pleasant 79 yo man with a h/o palpitations, and sinus bradycardia. He is reported to have atrial tachycardia but this has not been well documented. He has been stable in the interim except that he notes that he has a hard time getting his heart rate above 100/min with exercise. No syncope. No peripheral edema.  Allergies  Allergen Reactions  . Fluroxene Hives     Current Outpatient Prescriptions  Medication Sig Dispense Refill  . aspirin 81 MG tablet Take 81 mg by mouth daily.      . carvedilol (COREG) 3.125 MG tablet Take 1 tablet (3.125 mg total) by mouth 2 (two) times daily with a meal.  180 tablet  3  . enalapril (VASOTEC) 2.5 MG tablet Take 1 tablet (2.5 mg total) by mouth 2 (two) times daily.  180 tablet  3  . esomeprazole (NEXIUM) 40 MG capsule Take 40 mg by mouth as needed.      . Sildenafil Citrate (VIAGRA PO) Take by mouth. As needed       No current facility-administered medications for this visit.     Past Medical History  Diagnosis Date  . GERD (gastroesophageal reflux disease)   . Arthritis     back    ROS:   All systems reviewed and negative except as noted in the HPI.   Past Surgical History  Procedure Laterality Date  . Colonoscopy    . Polypectomy    . Cataract extraction      both eyes  . Carpel tunnel      both wrists     Family History  Problem Relation Age of Onset  . Colon cancer Neg Hx   . Esophageal cancer Neg Hx   . Rectal cancer Neg Hx   . Stomach cancer Neg Hx      History   Social History  . Marital Status: Married    Spouse Name: N/A    Number of Children: N/A  . Years of Education: N/A   Occupational History  . Not on file.   Social History Main Topics  . Smoking status: Never Smoker   . Smokeless tobacco: Never Used  . Alcohol Use: 4.2 oz/week    7 Glasses of wine per week  . Drug Use: No  . Sexually Active:    Other Topics Concern  . Not on file   Social  History Narrative  . No narrative on file     BP 120/70  Pulse 72  Ht 5\' 10"  (1.778 m)  Wt 173 lb 9.6 oz (78.744 kg)  BMI 24.91 kg/m2  Physical Exam:  Well appearing 79 yo man, NAD HEENT: Unremarkable Neck:  7 cm JVD, no thyromegally Back:  No CVA tenderness Lungs:  Clear with no wheezes, rales, or rhonchi. HEART:  Regular rate rhythm, no murmurs, no rubs, no clicks Abd:  soft, positive bowel sounds, no organomegally, no rebound, no guarding Ext:  2 plus pulses, no edema, no cyanosis, no clubbing Skin:  No rashes no nodules Neuro:  CN II through XII intact, motor grossly intact  DEVICE  Normal device function.  See PaceArt for details.   Assess/Plan:

## 2012-06-24 DIAGNOSIS — E785 Hyperlipidemia, unspecified: Secondary | ICD-10-CM | POA: Diagnosis not present

## 2012-06-24 DIAGNOSIS — N183 Chronic kidney disease, stage 3 unspecified: Secondary | ICD-10-CM | POA: Diagnosis not present

## 2012-06-25 DIAGNOSIS — H00019 Hordeolum externum unspecified eye, unspecified eyelid: Secondary | ICD-10-CM | POA: Diagnosis not present

## 2012-07-01 DIAGNOSIS — I2510 Atherosclerotic heart disease of native coronary artery without angina pectoris: Secondary | ICD-10-CM | POA: Diagnosis not present

## 2012-07-01 DIAGNOSIS — R7309 Other abnormal glucose: Secondary | ICD-10-CM | POA: Diagnosis not present

## 2012-07-01 DIAGNOSIS — E785 Hyperlipidemia, unspecified: Secondary | ICD-10-CM | POA: Diagnosis not present

## 2012-07-01 DIAGNOSIS — Z1331 Encounter for screening for depression: Secondary | ICD-10-CM | POA: Diagnosis not present

## 2012-07-01 DIAGNOSIS — N183 Chronic kidney disease, stage 3 unspecified: Secondary | ICD-10-CM | POA: Diagnosis not present

## 2012-07-01 DIAGNOSIS — Z0000 Encounter for general adult medical examination without abnormal findings: Secondary | ICD-10-CM | POA: Diagnosis not present

## 2012-07-01 DIAGNOSIS — K219 Gastro-esophageal reflux disease without esophagitis: Secondary | ICD-10-CM | POA: Diagnosis not present

## 2012-07-01 DIAGNOSIS — Z79899 Other long term (current) drug therapy: Secondary | ICD-10-CM | POA: Diagnosis not present

## 2012-07-01 DIAGNOSIS — R0602 Shortness of breath: Secondary | ICD-10-CM | POA: Diagnosis not present

## 2012-09-01 ENCOUNTER — Other Ambulatory Visit: Payer: Self-pay

## 2012-11-15 ENCOUNTER — Ambulatory Visit (INDEPENDENT_AMBULATORY_CARE_PROVIDER_SITE_OTHER): Payer: Medicare Other | Admitting: Internal Medicine

## 2012-11-15 ENCOUNTER — Encounter: Payer: Self-pay | Admitting: Internal Medicine

## 2012-11-15 VITALS — BP 115/71 | HR 44 | Ht 70.0 in | Wt 172.8 lb

## 2012-11-15 DIAGNOSIS — R002 Palpitations: Secondary | ICD-10-CM

## 2012-11-15 DIAGNOSIS — I498 Other specified cardiac arrhythmias: Secondary | ICD-10-CM

## 2012-11-15 DIAGNOSIS — R001 Bradycardia, unspecified: Secondary | ICD-10-CM

## 2012-11-15 NOTE — Assessment & Plan Note (Signed)
His symptoms are currently infrequent. We will stop his beta blocker today. Hopefully his palpitations will not increase.

## 2012-11-15 NOTE — Progress Notes (Signed)
HPI Jaime Gates returns today for followup. He is a pleasant 79 yo man with a h/o palpitations, and sinus bradycardia. He is reported to have atrial tachycardia but this has not been well documented. He has been stable in the interim except that he notes that he has a hard time getting his heart rate above 70/min with exercise.  In addition, his exercise ability has gone down. He takes careful measurements of his heart rate with his blood pressure cuff, he notes that his exercise heart rate is rarely above 70 beats per minute.No syncope. No peripheral edema. He notes increased dyspnea with exertion and a reduction in his energy level and ability to exercise. Allergies  Allergen Reactions  . Fluroxene Hives     Current Outpatient Prescriptions  Medication Sig Dispense Refill  . aspirin 81 MG tablet Take 81 mg by mouth daily.      . carvedilol (COREG) 3.125 MG tablet Take 1 tablet (3.125 mg total) by mouth 2 (two) times daily with a meal.  180 tablet  3  . enalapril (VASOTEC) 2.5 MG tablet Take 1 tablet (2.5 mg total) by mouth 2 (two) times daily.  180 tablet  3  . esomeprazole (NEXIUM) 40 MG capsule Take 40 mg by mouth as needed.      . Sildenafil Citrate (VIAGRA PO) Take by mouth. As needed       No current facility-administered medications for this visit.     Past Medical History  Diagnosis Date  . GERD (gastroesophageal reflux disease)   . Arthritis     back    ROS:   All systems reviewed and negative except as noted in the HPI.   Past Surgical History  Procedure Laterality Date  . Colonoscopy    . Polypectomy    . Cataract extraction      both eyes  . Carpel tunnel      both wrists     Family History  Problem Relation Age of Onset  . Colon cancer Neg Hx   . Esophageal cancer Neg Hx   . Rectal cancer Neg Hx   . Stomach cancer Neg Hx      History   Social History  . Marital Status: Married    Spouse Name: N/A    Number of Children: N/A  . Years of Education:  N/A   Occupational History  . Not on file.   Social History Main Topics  . Smoking status: Never Smoker   . Smokeless tobacco: Never Used  . Alcohol Use: 4.2 oz/week    7 Glasses of wine per week  . Drug Use: No  . Sexual Activity:    Other Topics Concern  . Not on file   Social History Narrative  . No narrative on file     BP 115/71  Pulse 44  Ht 5\' 10"  (1.778 m)  Wt 172 lb 12.8 oz (78.382 kg)  BMI 24.79 kg/m2  Physical Exam:  Well appearing 79 yo man, NAD HEENT: Unremarkable Neck:  7 cm JVD, no thyromegally Back:  No CVA tenderness Lungs:  Clear with no wheezes, rales, or rhonchi. HEART:  Regular rate rhythm, no murmurs, no rubs, no clicks Abd:  soft, positive bowel sounds, no organomegally, no rebound, no guarding Ext:  2 plus pulses, no edema, no cyanosis, no clubbing Skin:  No rashes no nodules Neuro:  CN II through XII intact, motor grossly intact   Assess/Plan:

## 2012-11-15 NOTE — Patient Instructions (Addendum)
Your physician recommends that you schedule a follow-up appointment in: 4 weeks with Dr Ladona Ridgel  Your physician has recommended you make the following change in your medication:  1) Stop Carvedilol   Your physician has recommended that you wear a holter monitor. Holter monitors are medical devices that record the heart's electrical activity. Doctors most often use these monitors to diagnose arrhythmias. Arrhythmias are problems with the speed or rhythm of the heartbeat. The monitor is a small, portable device. You can wear one while you do your normal daily activities. This is usually used to diagnose what is causing palpitations/syncope (passing out).---24 hour monitor next week

## 2012-11-15 NOTE — Assessment & Plan Note (Signed)
The patient has sinus node dysfunction, and I suspect chronotropic incompetence. We discontinued his low dose beta blocker today. We will have him wear a cardiac monitor for 24 hours. I will see him back after this. I suspect permanent pacemaker insertion would be of benefit to this patient.

## 2012-11-22 ENCOUNTER — Encounter: Payer: Self-pay | Admitting: Radiology

## 2012-11-22 ENCOUNTER — Encounter (INDEPENDENT_AMBULATORY_CARE_PROVIDER_SITE_OTHER): Payer: Medicare Other

## 2012-11-22 DIAGNOSIS — R002 Palpitations: Secondary | ICD-10-CM | POA: Diagnosis not present

## 2012-11-22 DIAGNOSIS — R001 Bradycardia, unspecified: Secondary | ICD-10-CM

## 2012-11-22 DIAGNOSIS — I498 Other specified cardiac arrhythmias: Secondary | ICD-10-CM | POA: Diagnosis not present

## 2012-11-22 NOTE — Progress Notes (Signed)
Patient ID: Jaime Gates, male   DOB: Jun 24, 1931, 79 y.o.   MRN: [id number] E cardio 24hr Holter monitor applied

## 2012-12-02 ENCOUNTER — Other Ambulatory Visit: Payer: Self-pay

## 2012-12-15 ENCOUNTER — Encounter: Payer: Self-pay | Admitting: Internal Medicine

## 2012-12-15 ENCOUNTER — Ambulatory Visit (INDEPENDENT_AMBULATORY_CARE_PROVIDER_SITE_OTHER): Payer: Medicare Other | Admitting: Internal Medicine

## 2012-12-15 VITALS — BP 105/69 | HR 46 | Ht 70.0 in | Wt 173.0 lb

## 2012-12-15 DIAGNOSIS — R002 Palpitations: Secondary | ICD-10-CM

## 2012-12-15 DIAGNOSIS — I498 Other specified cardiac arrhythmias: Secondary | ICD-10-CM | POA: Diagnosis not present

## 2012-12-15 DIAGNOSIS — R001 Bradycardia, unspecified: Secondary | ICD-10-CM

## 2012-12-15 NOTE — Assessment & Plan Note (Signed)
His symptoms are fairly well controlled. He'll continue his current medical therapy. After review of his cardiac monitor, I recommended watchful waiting. He has nonsustained atrial and ventricular arrhythmias.

## 2012-12-15 NOTE — Progress Notes (Signed)
HPI [name] returns today for followup. He is a pleasant 79 yo man with a h/o palpitations, and sinus bradycardia. He is reported to have atrial tachycardia but this has not been well documented. He has been stable in the interim except that he notes that he has a hard time getting his heart rate above 70/min with exercise.  In addition, his exercise ability has gone down. He takes careful measurements of his heart rate with his blood pressure cuff, he notes that his exercise heart rate is rarely above 70 beats per minute.No syncope. No peripheral edema. Since I saw the patient last, he has worn a cardiac monitor which demonstrated appropriate heart rates. He did have nonsustained atrial tachycardia and fairly frequent PVCs. There were no sustained atrial or ventricular arrhythmias. His maximum heart rate was 120 beats per minute though his average heart rate was 60 beats per minute. Allergies  Allergen Reactions  . Fluroxene Hives     Current Outpatient Prescriptions  Medication Sig Dispense Refill  . aspirin 81 MG tablet Take 81 mg by mouth daily.      . enalapril (VASOTEC) 2.5 MG tablet Take 1 tablet (2.5 mg total) by mouth 2 (two) times daily.  180 tablet  3  . esomeprazole (NEXIUM) 40 MG capsule Take 40 mg by mouth as needed.      . Sildenafil Citrate (VIAGRA PO) Take by mouth. As needed       No current facility-administered medications for this visit.     Past Medical History  Diagnosis Date  . GERD (gastroesophageal reflux disease)   . Arthritis     back    ROS:   All systems reviewed and negative except as noted in the HPI.   Past Surgical History  Procedure Laterality Date  . Colonoscopy    . Polypectomy    . Cataract extraction      both eyes  . Carpel tunnel      both wrists     Family History  Problem Relation Age of Onset  . Colon cancer Neg Hx   . Esophageal cancer Neg Hx   . Rectal cancer Neg Hx   . Stomach cancer Neg Hx      History   Social  History  . Marital Status: Married    Spouse Name: N/A    Number of Children: N/A  . Years of Education: N/A   Occupational History  . Not on file.   Social History Main Topics  . Smoking status: Never Smoker   . Smokeless tobacco: Never Used  . Alcohol Use: 4.2 oz/week    7 Glasses of wine per week  . Drug Use: No  . Sexual Activity:    Other Topics Concern  . Not on file   Social History Narrative  . No narrative on file     BP 105/69  Pulse 46  Ht 5\' 10"  (1.778 m)  Wt 173 lb (78.472 kg)  BMI 24.82 kg/m2  Physical Exam:  Well appearing 80 yo man, NAD HEENT: Unremarkable Neck:  6 cm JVD, no thyromegally Back:  No CVA tenderness Lungs:  Clear with no wheezes, rales, or rhonchi. HEART:  Regular rate rhythm, no murmurs, no rubs, no clicks Abd:  soft, positive bowel sounds, no organomegally, no rebound, no guarding Ext:  2 plus pulses, no edema, no cyanosis, no clubbing Skin:  No rashes no nodules Neuro:  CN II through XII intact, motor grossly intact   Assess/Plan:

## 2012-12-15 NOTE — Assessment & Plan Note (Signed)
The patient's bradycardia appears to be asymptomatic at this point. There is currently no indication for permanent pacemaker insertion. He will undergo watchful waiting.

## 2012-12-15 NOTE — Patient Instructions (Signed)
Your physician wants you to follow-up in: 12 months with Dr Court Joy will receive a reminder letter in the mail two months in advance. If you don't receive a letter, please call our office to schedule the follow-up appointment.  Dr Ladona Ridgel says if he feels okay in 12 months he can cancel and follow up as needed

## 2013-03-22 DIAGNOSIS — L821 Other seborrheic keratosis: Secondary | ICD-10-CM | POA: Diagnosis not present

## 2013-03-22 DIAGNOSIS — D239 Other benign neoplasm of skin, unspecified: Secondary | ICD-10-CM | POA: Diagnosis not present

## 2013-03-22 DIAGNOSIS — D1801 Hemangioma of skin and subcutaneous tissue: Secondary | ICD-10-CM | POA: Diagnosis not present

## 2013-03-22 DIAGNOSIS — L738 Other specified follicular disorders: Secondary | ICD-10-CM | POA: Diagnosis not present

## 2013-03-22 DIAGNOSIS — L819 Disorder of pigmentation, unspecified: Secondary | ICD-10-CM | POA: Diagnosis not present

## 2013-11-16 DIAGNOSIS — E785 Hyperlipidemia, unspecified: Secondary | ICD-10-CM | POA: Diagnosis not present

## 2013-11-16 DIAGNOSIS — R739 Hyperglycemia, unspecified: Secondary | ICD-10-CM | POA: Diagnosis not present

## 2013-11-16 DIAGNOSIS — N183 Chronic kidney disease, stage 3 (moderate): Secondary | ICD-10-CM | POA: Diagnosis not present

## 2013-11-23 DIAGNOSIS — Z6825 Body mass index (BMI) 25.0-25.9, adult: Secondary | ICD-10-CM | POA: Diagnosis not present

## 2013-11-23 DIAGNOSIS — I2510 Atherosclerotic heart disease of native coronary artery without angina pectoris: Secondary | ICD-10-CM | POA: Diagnosis not present

## 2013-11-23 DIAGNOSIS — N183 Chronic kidney disease, stage 3 (moderate): Secondary | ICD-10-CM | POA: Diagnosis not present

## 2013-11-23 DIAGNOSIS — Z0000 Encounter for general adult medical examination without abnormal findings: Secondary | ICD-10-CM | POA: Diagnosis not present

## 2013-11-23 DIAGNOSIS — R739 Hyperglycemia, unspecified: Secondary | ICD-10-CM | POA: Diagnosis not present

## 2013-11-23 DIAGNOSIS — Z1389 Encounter for screening for other disorder: Secondary | ICD-10-CM | POA: Diagnosis not present

## 2013-11-23 DIAGNOSIS — E785 Hyperlipidemia, unspecified: Secondary | ICD-10-CM | POA: Diagnosis not present

## 2014-01-03 DIAGNOSIS — H26492 Other secondary cataract, left eye: Secondary | ICD-10-CM | POA: Diagnosis not present

## 2014-02-13 DIAGNOSIS — H26492 Other secondary cataract, left eye: Secondary | ICD-10-CM | POA: Diagnosis not present

## 2014-03-23 DIAGNOSIS — L812 Freckles: Secondary | ICD-10-CM | POA: Diagnosis not present

## 2014-03-23 DIAGNOSIS — D1801 Hemangioma of skin and subcutaneous tissue: Secondary | ICD-10-CM | POA: Diagnosis not present

## 2014-03-23 DIAGNOSIS — L814 Other melanin hyperpigmentation: Secondary | ICD-10-CM | POA: Diagnosis not present

## 2014-03-23 DIAGNOSIS — L821 Other seborrheic keratosis: Secondary | ICD-10-CM | POA: Diagnosis not present

## 2014-10-31 ENCOUNTER — Encounter: Payer: Self-pay | Admitting: Gastroenterology

## 2014-11-22 DIAGNOSIS — N183 Chronic kidney disease, stage 3 (moderate): Secondary | ICD-10-CM | POA: Diagnosis not present

## 2014-11-22 DIAGNOSIS — E785 Hyperlipidemia, unspecified: Secondary | ICD-10-CM | POA: Diagnosis not present

## 2014-11-22 DIAGNOSIS — R739 Hyperglycemia, unspecified: Secondary | ICD-10-CM | POA: Diagnosis not present

## 2014-11-22 DIAGNOSIS — Z125 Encounter for screening for malignant neoplasm of prostate: Secondary | ICD-10-CM | POA: Diagnosis not present

## 2014-11-29 DIAGNOSIS — N183 Chronic kidney disease, stage 3 (moderate): Secondary | ICD-10-CM | POA: Diagnosis not present

## 2014-11-29 DIAGNOSIS — R972 Elevated prostate specific antigen [PSA]: Secondary | ICD-10-CM | POA: Diagnosis not present

## 2014-11-29 DIAGNOSIS — Z6825 Body mass index (BMI) 25.0-25.9, adult: Secondary | ICD-10-CM | POA: Diagnosis not present

## 2014-11-29 DIAGNOSIS — Z1389 Encounter for screening for other disorder: Secondary | ICD-10-CM | POA: Diagnosis not present

## 2014-11-29 DIAGNOSIS — R739 Hyperglycemia, unspecified: Secondary | ICD-10-CM | POA: Diagnosis not present

## 2014-11-29 DIAGNOSIS — I2510 Atherosclerotic heart disease of native coronary artery without angina pectoris: Secondary | ICD-10-CM | POA: Diagnosis not present

## 2014-11-29 DIAGNOSIS — E785 Hyperlipidemia, unspecified: Secondary | ICD-10-CM | POA: Diagnosis not present

## 2014-11-29 DIAGNOSIS — Z0000 Encounter for general adult medical examination without abnormal findings: Secondary | ICD-10-CM | POA: Diagnosis not present

## 2014-11-29 DIAGNOSIS — I429 Cardiomyopathy, unspecified: Secondary | ICD-10-CM | POA: Diagnosis not present

## 2015-03-01 ENCOUNTER — Encounter: Payer: Self-pay | Admitting: Cardiology

## 2015-03-29 DIAGNOSIS — L812 Freckles: Secondary | ICD-10-CM | POA: Diagnosis not present

## 2015-03-29 DIAGNOSIS — L821 Other seborrheic keratosis: Secondary | ICD-10-CM | POA: Diagnosis not present

## 2015-03-29 DIAGNOSIS — D1801 Hemangioma of skin and subcutaneous tissue: Secondary | ICD-10-CM | POA: Diagnosis not present

## 2015-07-11 DIAGNOSIS — L259 Unspecified contact dermatitis, unspecified cause: Secondary | ICD-10-CM | POA: Diagnosis not present

## 2015-10-03 DIAGNOSIS — Z23 Encounter for immunization: Secondary | ICD-10-CM | POA: Diagnosis not present

## 2015-11-28 DIAGNOSIS — E784 Other hyperlipidemia: Secondary | ICD-10-CM | POA: Diagnosis not present

## 2015-11-28 DIAGNOSIS — N183 Chronic kidney disease, stage 3 (moderate): Secondary | ICD-10-CM | POA: Diagnosis not present

## 2015-11-28 DIAGNOSIS — Z125 Encounter for screening for malignant neoplasm of prostate: Secondary | ICD-10-CM | POA: Diagnosis not present

## 2015-11-28 DIAGNOSIS — R8299 Other abnormal findings in urine: Secondary | ICD-10-CM | POA: Diagnosis not present

## 2015-11-28 DIAGNOSIS — R7309 Other abnormal glucose: Secondary | ICD-10-CM | POA: Diagnosis not present

## 2015-12-18 DIAGNOSIS — R05 Cough: Secondary | ICD-10-CM | POA: Diagnosis not present

## 2015-12-18 DIAGNOSIS — R739 Hyperglycemia, unspecified: Secondary | ICD-10-CM | POA: Diagnosis not present

## 2015-12-18 DIAGNOSIS — Z6825 Body mass index (BMI) 25.0-25.9, adult: Secondary | ICD-10-CM | POA: Diagnosis not present

## 2015-12-18 DIAGNOSIS — I2510 Atherosclerotic heart disease of native coronary artery without angina pectoris: Secondary | ICD-10-CM | POA: Diagnosis not present

## 2015-12-18 DIAGNOSIS — Z0000 Encounter for general adult medical examination without abnormal findings: Secondary | ICD-10-CM | POA: Diagnosis not present

## 2015-12-18 DIAGNOSIS — Z1389 Encounter for screening for other disorder: Secondary | ICD-10-CM | POA: Diagnosis not present

## 2015-12-18 DIAGNOSIS — E784 Other hyperlipidemia: Secondary | ICD-10-CM | POA: Diagnosis not present

## 2015-12-18 DIAGNOSIS — R3121 Asymptomatic microscopic hematuria: Secondary | ICD-10-CM | POA: Diagnosis not present

## 2015-12-25 DIAGNOSIS — R3121 Asymptomatic microscopic hematuria: Secondary | ICD-10-CM | POA: Diagnosis not present

## 2016-03-28 DIAGNOSIS — D1801 Hemangioma of skin and subcutaneous tissue: Secondary | ICD-10-CM | POA: Diagnosis not present

## 2016-03-28 DIAGNOSIS — L812 Freckles: Secondary | ICD-10-CM | POA: Diagnosis not present

## 2016-03-28 DIAGNOSIS — L821 Other seborrheic keratosis: Secondary | ICD-10-CM | POA: Diagnosis not present

## 2016-05-16 DIAGNOSIS — H903 Sensorineural hearing loss, bilateral: Secondary | ICD-10-CM | POA: Diagnosis not present

## 2016-12-16 DIAGNOSIS — E7849 Other hyperlipidemia: Secondary | ICD-10-CM | POA: Diagnosis not present

## 2016-12-16 DIAGNOSIS — R82998 Other abnormal findings in urine: Secondary | ICD-10-CM | POA: Diagnosis not present

## 2016-12-16 DIAGNOSIS — R7309 Other abnormal glucose: Secondary | ICD-10-CM | POA: Diagnosis not present

## 2016-12-16 DIAGNOSIS — Z125 Encounter for screening for malignant neoplasm of prostate: Secondary | ICD-10-CM | POA: Diagnosis not present

## 2016-12-22 DIAGNOSIS — R7309 Other abnormal glucose: Secondary | ICD-10-CM | POA: Diagnosis not present

## 2016-12-22 DIAGNOSIS — Z1389 Encounter for screening for other disorder: Secondary | ICD-10-CM | POA: Diagnosis not present

## 2016-12-22 DIAGNOSIS — Z0000 Encounter for general adult medical examination without abnormal findings: Secondary | ICD-10-CM | POA: Diagnosis not present

## 2016-12-22 DIAGNOSIS — I2510 Atherosclerotic heart disease of native coronary artery without angina pectoris: Secondary | ICD-10-CM | POA: Diagnosis not present

## 2016-12-22 DIAGNOSIS — K219 Gastro-esophageal reflux disease without esophagitis: Secondary | ICD-10-CM | POA: Diagnosis not present

## 2016-12-22 DIAGNOSIS — Z6826 Body mass index (BMI) 26.0-26.9, adult: Secondary | ICD-10-CM | POA: Diagnosis not present

## 2016-12-22 DIAGNOSIS — R351 Nocturia: Secondary | ICD-10-CM | POA: Diagnosis not present

## 2016-12-22 DIAGNOSIS — H903 Sensorineural hearing loss, bilateral: Secondary | ICD-10-CM | POA: Diagnosis not present

## 2016-12-22 DIAGNOSIS — E7849 Other hyperlipidemia: Secondary | ICD-10-CM | POA: Diagnosis not present

## 2017-01-01 DIAGNOSIS — Z1212 Encounter for screening for malignant neoplasm of rectum: Secondary | ICD-10-CM | POA: Diagnosis not present

## 2017-04-13 DIAGNOSIS — D2272 Melanocytic nevi of left lower limb, including hip: Secondary | ICD-10-CM | POA: Diagnosis not present

## 2017-04-13 DIAGNOSIS — D1801 Hemangioma of skin and subcutaneous tissue: Secondary | ICD-10-CM | POA: Diagnosis not present

## 2017-04-13 DIAGNOSIS — L821 Other seborrheic keratosis: Secondary | ICD-10-CM | POA: Diagnosis not present

## 2017-09-29 DIAGNOSIS — J209 Acute bronchitis, unspecified: Secondary | ICD-10-CM | POA: Diagnosis not present

## 2017-09-29 DIAGNOSIS — R05 Cough: Secondary | ICD-10-CM | POA: Diagnosis not present

## 2017-09-29 DIAGNOSIS — Z6825 Body mass index (BMI) 25.0-25.9, adult: Secondary | ICD-10-CM | POA: Diagnosis not present

## 2017-09-29 DIAGNOSIS — H6091 Unspecified otitis externa, right ear: Secondary | ICD-10-CM | POA: Diagnosis not present

## 2017-10-23 DIAGNOSIS — Z6825 Body mass index (BMI) 25.0-25.9, adult: Secondary | ICD-10-CM | POA: Diagnosis not present

## 2017-10-23 DIAGNOSIS — R05 Cough: Secondary | ICD-10-CM | POA: Diagnosis not present

## 2017-10-23 DIAGNOSIS — K219 Gastro-esophageal reflux disease without esophagitis: Secondary | ICD-10-CM | POA: Diagnosis not present

## 2017-12-22 DIAGNOSIS — R82998 Other abnormal findings in urine: Secondary | ICD-10-CM | POA: Diagnosis not present

## 2017-12-22 DIAGNOSIS — E7849 Other hyperlipidemia: Secondary | ICD-10-CM | POA: Diagnosis not present

## 2017-12-22 DIAGNOSIS — Z125 Encounter for screening for malignant neoplasm of prostate: Secondary | ICD-10-CM | POA: Diagnosis not present

## 2017-12-22 DIAGNOSIS — R7309 Other abnormal glucose: Secondary | ICD-10-CM | POA: Diagnosis not present

## 2017-12-29 DIAGNOSIS — I2510 Atherosclerotic heart disease of native coronary artery without angina pectoris: Secondary | ICD-10-CM | POA: Diagnosis not present

## 2017-12-29 DIAGNOSIS — R002 Palpitations: Secondary | ICD-10-CM | POA: Diagnosis not present

## 2017-12-29 DIAGNOSIS — K219 Gastro-esophageal reflux disease without esophagitis: Secondary | ICD-10-CM | POA: Diagnosis not present

## 2017-12-29 DIAGNOSIS — Z0000 Encounter for general adult medical examination without abnormal findings: Secondary | ICD-10-CM | POA: Diagnosis not present

## 2017-12-29 DIAGNOSIS — R7309 Other abnormal glucose: Secondary | ICD-10-CM | POA: Diagnosis not present

## 2017-12-29 DIAGNOSIS — R062 Wheezing: Secondary | ICD-10-CM | POA: Diagnosis not present

## 2017-12-29 DIAGNOSIS — Z1389 Encounter for screening for other disorder: Secondary | ICD-10-CM | POA: Diagnosis not present

## 2017-12-29 DIAGNOSIS — J3089 Other allergic rhinitis: Secondary | ICD-10-CM | POA: Diagnosis not present

## 2017-12-29 DIAGNOSIS — M6281 Muscle weakness (generalized): Secondary | ICD-10-CM | POA: Diagnosis not present

## 2017-12-29 DIAGNOSIS — N183 Chronic kidney disease, stage 3 (moderate): Secondary | ICD-10-CM | POA: Diagnosis not present

## 2017-12-29 DIAGNOSIS — E7849 Other hyperlipidemia: Secondary | ICD-10-CM | POA: Diagnosis not present

## 2017-12-29 DIAGNOSIS — Z6825 Body mass index (BMI) 25.0-25.9, adult: Secondary | ICD-10-CM | POA: Diagnosis not present

## 2017-12-31 ENCOUNTER — Other Ambulatory Visit (HOSPITAL_COMMUNITY): Payer: Self-pay | Admitting: Respiratory Therapy

## 2017-12-31 DIAGNOSIS — Z1212 Encounter for screening for malignant neoplasm of rectum: Secondary | ICD-10-CM | POA: Diagnosis not present

## 2017-12-31 DIAGNOSIS — R062 Wheezing: Secondary | ICD-10-CM

## 2018-01-01 ENCOUNTER — Other Ambulatory Visit (HOSPITAL_COMMUNITY): Payer: Self-pay | Admitting: Respiratory Therapy

## 2018-01-12 ENCOUNTER — Ambulatory Visit (HOSPITAL_COMMUNITY)
Admission: RE | Admit: 2018-01-12 | Discharge: 2018-01-12 | Disposition: A | Discharge status: Home / Self Care | Payer: Medicare Other | Source: Ambulatory Visit | Attending: Internal Medicine | Admitting: Internal Medicine

## 2018-01-12 DIAGNOSIS — R062 Wheezing: Secondary | ICD-10-CM | POA: Insufficient documentation

## 2018-01-12 LAB — PULMONARY FUNCTION TEST
DL/VA % pred: 99 %
DL/VA: 4.57 ml/min/mmHg/L
DLCO unc % pred: 79 %
DLCO unc: 25.85 ml/min/mmHg
FEF 25-75 Post: 1.93 L/sec
FEF 25-75 Pre: 1.27 L/sec
FEF2575-%CHANGE-POST: 52 %
FEF2575-%Pred-Post: 108 %
FEF2575-%Pred-Pre: 71 %
FEV1-%Change-Post: 14 %
FEV1-%Pred-Post: 91 %
FEV1-%Pred-Pre: 79 %
FEV1-PRE: 2.16 L
FEV1-Post: 2.46 L
FEV1FVC-%Change-Post: 4 %
FEV1FVC-%Pred-Pre: 90 %
FEV6-%Change-Post: 9 %
FEV6-%PRED-PRE: 92 %
FEV6-%Pred-Post: 100 %
FEV6-Post: 3.6 L
FEV6-Pre: 3.3 L
FEV6FVC-%Change-Post: 0 %
FEV6FVC-%Pred-Post: 104 %
FEV6FVC-%Pred-Pre: 105 %
FVC-%Change-Post: 9 %
FVC-%PRED-PRE: 87 %
FVC-%Pred-Post: 96 %
FVC-POST: 3.7 L
FVC-PRE: 3.37 L
PRE FEV6/FVC RATIO: 98 %
Post FEV1/FVC ratio: 67 %
Post FEV6/FVC ratio: 97 %
Pre FEV1/FVC ratio: 64 %
RV % pred: 143 %
RV: 3.93 L
TLC % pred: 103 %
TLC: 7.33 L

## 2018-01-12 MED ORDER — ALBUTEROL SULFATE (2.5 MG/3ML) 0.083% IN NEBU
2.5000 mg | INHALATION_SOLUTION | Freq: Once | RESPIRATORY_TRACT | Status: AC
  Administered 2018-01-12: 2.5 mg via RESPIRATORY_TRACT

## 2018-04-20 DIAGNOSIS — L821 Other seborrheic keratosis: Secondary | ICD-10-CM | POA: Diagnosis not present

## 2018-04-20 DIAGNOSIS — D692 Other nonthrombocytopenic purpura: Secondary | ICD-10-CM | POA: Diagnosis not present

## 2018-04-20 DIAGNOSIS — L812 Freckles: Secondary | ICD-10-CM | POA: Diagnosis not present

## 2018-04-20 DIAGNOSIS — D1801 Hemangioma of skin and subcutaneous tissue: Secondary | ICD-10-CM | POA: Diagnosis not present

## 2018-05-10 DIAGNOSIS — S61219A Laceration without foreign body of unspecified finger without damage to nail, initial encounter: Secondary | ICD-10-CM | POA: Diagnosis not present

## 2018-05-10 DIAGNOSIS — M79644 Pain in right finger(s): Secondary | ICD-10-CM | POA: Diagnosis not present

## 2018-05-17 DIAGNOSIS — S61219A Laceration without foreign body of unspecified finger without damage to nail, initial encounter: Secondary | ICD-10-CM | POA: Diagnosis not present

## 2018-05-17 DIAGNOSIS — M79644 Pain in right finger(s): Secondary | ICD-10-CM | POA: Diagnosis not present

## 2018-05-26 DIAGNOSIS — S61219D Laceration without foreign body of unspecified finger without damage to nail, subsequent encounter: Secondary | ICD-10-CM | POA: Diagnosis not present

## 2018-05-26 DIAGNOSIS — M79644 Pain in right finger(s): Secondary | ICD-10-CM | POA: Diagnosis not present

## 2018-06-24 DIAGNOSIS — Z1159 Encounter for screening for other viral diseases: Secondary | ICD-10-CM | POA: Diagnosis not present

## 2018-06-29 DIAGNOSIS — E785 Hyperlipidemia, unspecified: Secondary | ICD-10-CM | POA: Diagnosis not present

## 2018-06-29 DIAGNOSIS — I2510 Atherosclerotic heart disease of native coronary artery without angina pectoris: Secondary | ICD-10-CM | POA: Diagnosis not present

## 2018-06-29 DIAGNOSIS — R05 Cough: Secondary | ICD-10-CM | POA: Diagnosis not present

## 2018-12-30 DIAGNOSIS — R739 Hyperglycemia, unspecified: Secondary | ICD-10-CM | POA: Diagnosis not present

## 2018-12-30 DIAGNOSIS — Z125 Encounter for screening for malignant neoplasm of prostate: Secondary | ICD-10-CM | POA: Diagnosis not present

## 2018-12-30 DIAGNOSIS — R82998 Other abnormal findings in urine: Secondary | ICD-10-CM | POA: Diagnosis not present

## 2018-12-30 DIAGNOSIS — Z23 Encounter for immunization: Secondary | ICD-10-CM | POA: Diagnosis not present

## 2018-12-30 DIAGNOSIS — E7849 Other hyperlipidemia: Secondary | ICD-10-CM | POA: Diagnosis not present

## 2019-01-17 DIAGNOSIS — R509 Fever, unspecified: Secondary | ICD-10-CM | POA: Diagnosis not present

## 2019-02-03 DIAGNOSIS — R0683 Snoring: Secondary | ICD-10-CM | POA: Diagnosis not present

## 2019-02-22 DIAGNOSIS — H903 Sensorineural hearing loss, bilateral: Secondary | ICD-10-CM | POA: Diagnosis not present

## 2019-02-22 DIAGNOSIS — H9313 Tinnitus, bilateral: Secondary | ICD-10-CM | POA: Diagnosis not present

## 2019-03-09 DIAGNOSIS — R002 Palpitations: Secondary | ICD-10-CM | POA: Diagnosis not present

## 2019-03-21 DIAGNOSIS — R002 Palpitations: Secondary | ICD-10-CM | POA: Diagnosis not present

## 2019-04-07 DIAGNOSIS — Z01810 Encounter for preprocedural cardiovascular examination: Secondary | ICD-10-CM | POA: Diagnosis not present

## 2019-04-20 DIAGNOSIS — D1801 Hemangioma of skin and subcutaneous tissue: Secondary | ICD-10-CM | POA: Diagnosis not present

## 2019-04-20 DIAGNOSIS — L821 Other seborrheic keratosis: Secondary | ICD-10-CM | POA: Diagnosis not present

## 2019-04-20 DIAGNOSIS — L218 Other seborrheic dermatitis: Secondary | ICD-10-CM | POA: Diagnosis not present

## 2019-05-03 DIAGNOSIS — Z20822 Contact with and (suspected) exposure to covid-19: Secondary | ICD-10-CM | POA: Diagnosis not present

## 2019-05-03 DIAGNOSIS — H903 Sensorineural hearing loss, bilateral: Secondary | ICD-10-CM | POA: Diagnosis not present

## 2019-05-03 DIAGNOSIS — Z01812 Encounter for preprocedural laboratory examination: Secondary | ICD-10-CM | POA: Diagnosis not present

## 2019-05-06 ENCOUNTER — Encounter (HOSPITAL_COMMUNITY): Payer: Self-pay

## 2019-05-06 ENCOUNTER — Other Ambulatory Visit: Payer: Self-pay

## 2019-05-06 ENCOUNTER — Emergency Department (HOSPITAL_COMMUNITY)
Admission: EM | Admit: 2019-05-06 | Discharge: 2019-05-06 | Disposition: A | Discharge status: Home / Self Care | Payer: Medicare Other | Attending: Emergency Medicine | Admitting: Emergency Medicine

## 2019-05-06 DIAGNOSIS — K219 Gastro-esophageal reflux disease without esophagitis: Secondary | ICD-10-CM | POA: Diagnosis not present

## 2019-05-06 DIAGNOSIS — R519 Headache, unspecified: Secondary | ICD-10-CM | POA: Insufficient documentation

## 2019-05-06 DIAGNOSIS — Z9621 Cochlear implant status: Secondary | ICD-10-CM | POA: Diagnosis not present

## 2019-05-06 DIAGNOSIS — I10 Essential (primary) hypertension: Secondary | ICD-10-CM | POA: Diagnosis not present

## 2019-05-06 DIAGNOSIS — R1030 Lower abdominal pain, unspecified: Secondary | ICD-10-CM | POA: Insufficient documentation

## 2019-05-06 DIAGNOSIS — Z9889 Other specified postprocedural states: Secondary | ICD-10-CM | POA: Insufficient documentation

## 2019-05-06 DIAGNOSIS — R339 Retention of urine, unspecified: Secondary | ICD-10-CM

## 2019-05-06 DIAGNOSIS — T8859XA Other complications of anesthesia, initial encounter: Secondary | ICD-10-CM

## 2019-05-06 DIAGNOSIS — H903 Sensorineural hearing loss, bilateral: Secondary | ICD-10-CM | POA: Diagnosis not present

## 2019-05-06 HISTORY — DX: Other complications of anesthesia, initial encounter: T88.59XA

## 2019-05-06 MED ORDER — ACETAMINOPHEN 325 MG PO TABS
650.0000 mg | ORAL_TABLET | Freq: Once | ORAL | Status: AC
  Administered 2019-05-06: 650 mg via ORAL
  Filled 2019-05-06: qty 2

## 2019-05-06 NOTE — ED Provider Notes (Signed)
[facility] EMERGENCY DEPARTMENT Provider Note   CSN: [id number] Arrival date & time: 05/06/19  1635     History Chief Complaint  Patient presents with  . Urinary Retention    Jaime Gates is a 86 y.o. male with PMHx GERD who presents to the ED today with complaint of urinary retention that began today. Pt had cochlear implant placed today on R side in [place], [place]. He reports that he urinated just prior to the procedure. He was discharged home in good condition however attempted to urinate several times at home without relief. Pt reports bladder pressure as well. When he arrived in the ED a foley catheter was placed and pt was placed back in the waiting room for 4 hours prior to coming back to a room. He reports his bladder pressure has resolved and he feels improved. His wife does mention he has had several episodes of epistaxis to R naris that started today as well. He reports he has been able to stop the bleeding each time within seconds. Pt is complaining of mild pain to his head that he rates a 2/10. He reports he was told to take Tylenol and was given #15 oxycodone for severe pain; pt did not have any tylenol on him so he took an oxycodone earlier with mild relief. Denies blurry vision, double vision, nausea, vomiting, or any other associated symptoms.   The history is provided by the patient and the spouse.       Past Medical History:  Diagnosis Date  . Arthritis    back  . GERD (gastroesophageal reflux disease)     Patient Active Problem List   Diagnosis Date Noted  . Bradycardia 05/19/2012  . Palpitations 02/12/2012  . Chronic combined systolic and diastolic heart failure ([place]) 02/12/2012  . Abnormal stress test 12/18/2011  . Atrial tachycardia ([place]) 12/18/2011  . Dyspnea 12/10/2011  . Dizzy spells 12/10/2011  . GERD (gastroesophageal reflux disease)   . Arthritis     Past Surgical History:  Procedure Laterality Date  . carpel tunnel     both wrists  . CATARACT EXTRACTION     both eyes  . COLONOSCOPY    . POLYPECTOMY         Family History  Problem Relation Age of Onset  . Colon cancer Neg Hx   . Esophageal cancer Neg Hx   . Rectal cancer Neg Hx   . Stomach cancer Neg Hx     Social History   Tobacco Use  . Smoking status: Never Smoker  . Smokeless tobacco: Never Used  Substance Use Topics  . Alcohol use: Yes    Alcohol/week: 7.0 standard drinks    Types: 7 Glasses of wine per week  . Drug use: No    Home Medications Prior to Admission medications   Medication Sig Start Date End Date Taking? Authorizing Provider  aspirin 81 MG tablet Take 81 mg by mouth daily.    [provider]  enalapril (VASOTEC) 2.5 MG tablet Take 1 tablet (2.5 mg total) by mouth 2 (two) times daily. 03/09/12   Evans Lance, MD  esomeprazole (NEXIUM) 40 MG capsule Take 40 mg by mouth as needed.    [provider]  Sildenafil Citrate (VIAGRA PO) Take by mouth. As needed    [provider]    Allergies    Fluroxene  Review of Systems   Review of Systems  Constitutional: Negative for chills and fever.  Gastrointestinal: Negative  for nausea and vomiting.  Genitourinary: Positive for difficulty urinating.  Neurological: Positive for headaches.  All other systems reviewed and are negative.   Physical Exam Updated Vital Signs BP (!) 149/71 Comment: Pt has low pulse since 1960's  Pulse (!) 49 Comment: Pt has low pulse since 1960's  Temp 97.7 F (36.5 C) (Oral)   Resp 16   SpO2 95% Comment: Pt has low pulse since 1960's  Physical Exam Vitals and nursing note reviewed.  Constitutional:      Appearance: He is not ill-appearing.  HENT:     Head: Normocephalic and atraumatic.  Eyes:     Conjunctiva/sclera: Conjunctivae normal.  Cardiovascular:     Rate and Rhythm: Normal rate and regular rhythm.     Pulses: Normal pulses.  Pulmonary:     Effort: Pulmonary effort is normal.     Breath sounds:  Normal breath sounds. No wheezing, rhonchi or rales.  Abdominal:     Palpations: Abdomen is soft.     Tenderness: There is no abdominal tenderness. There is no right CVA tenderness, left CVA tenderness, guarding or rebound.  Genitourinary:    Comments: Foley cath in place Musculoskeletal:     Cervical back: Neck supple.  Skin:    General: Skin is warm and dry.  Neurological:     Mental Status: He is alert.     ED Results / Procedures / Treatments   Labs (all labs ordered are listed, but only abnormal results are displayed) Labs Reviewed - No data to display  EKG None  Radiology No results found.  Procedures Procedures (including critical care time)  Medications Ordered in ED Medications  acetaminophen (TYLENOL) tablet 650 mg (650 mg Oral Given 05/06/19 2106)    ED Course  I have reviewed the triage vital signs and the nursing notes.  Pertinent labs & imaging results that were available during my care of the patient were reviewed by me and considered in my medical decision making (see chart for details).    MDM Rules/Calculators/A&P                      86 year old male who presents to the ED today complaining of urinary retention that started today.  Had cochlear implant placed on right side today and then was discharged from [facility].  Came to the ED and had Foley cath placed in triage.  States his symptoms have since resolved.  Is not documented how much was in the bladder scan prior to Foley catheter being placed.  Patient has almost double full liter urine currently however he has been in the ED for over 4 hours.  Given patient symptoms has resolved I do not feel he needs any lab work at this time.  His symptoms are likely related to anesthesia.  Will continue to keep Foley cath in place and have patient follow-up with urology next week to have it removed.  Discussed case with attending physician Dr. Johnney Killian who is evaluated patient as well and agrees with  plan.  This note was prepared using Dragon voice recognition software and may include unintentional dictation errors due to the inherent limitations of voice recognition software.  Final Clinical Impression(s) / ED Diagnoses Final diagnoses:  Urinary retention    Rx / DC Orders ED Discharge Orders    None       Discharge Instructions     Your urinary retention is likely related to the effects of anesthesia. Please keep foley  catheter in place for now. Follow up with [facility] next week to have the catheter removed. Call them Monday morning to schedule an appointment to be seen.        Eustaquio Maize, PA-C 05/06/19 2133    Charlesetta Shanks, MD 05/16/19 [phone]

## 2019-05-06 NOTE — Discharge Instructions (Signed)
Your urinary retention is likely related to the effects of anesthesia. Please keep foley catheter in place for now. Follow up with [facility] next week to have the catheter removed. Call them Monday morning to schedule an appointment to be seen.

## 2019-05-06 NOTE — ED Provider Notes (Signed)
Medical screening examination/treatment/procedure(s) were conducted as a shared visit with non-physician practitioner(s) and myself.  I personally evaluated the patient during the encounter.       Charlesetta Shanks, MD 05/06/19 2040

## 2019-05-06 NOTE — ED Notes (Signed)
Patient verbalizes understanding of discharge instructions. Opportunity for questioning and answers were provided. Armband removed by staff, pt discharged from ED. Education on draining catheter. Leg bag provided.

## 2019-05-06 NOTE — ED Triage Notes (Signed)
Patient had surgery this am to have cochlear implant paced and has been unable to urinate since that time. Patient denies any surgical site pain but complains of bladder pressure. Also reports nosebleed prior to arrival

## 2019-05-18 DIAGNOSIS — R338 Other retention of urine: Secondary | ICD-10-CM | POA: Diagnosis not present

## 2019-05-19 ENCOUNTER — Encounter (HOSPITAL_COMMUNITY): Payer: Self-pay | Admitting: Emergency Medicine

## 2019-05-19 ENCOUNTER — Emergency Department (HOSPITAL_COMMUNITY)
Admission: EM | Admit: 2019-05-19 | Discharge: 2019-05-19 | Disposition: A | Discharge status: Home / Self Care | Payer: Medicare Other | Attending: Emergency Medicine | Admitting: Emergency Medicine

## 2019-05-19 ENCOUNTER — Other Ambulatory Visit: Payer: Self-pay

## 2019-05-19 DIAGNOSIS — H903 Sensorineural hearing loss, bilateral: Secondary | ICD-10-CM | POA: Diagnosis not present

## 2019-05-19 DIAGNOSIS — R339 Retention of urine, unspecified: Secondary | ICD-10-CM | POA: Diagnosis not present

## 2019-05-19 DIAGNOSIS — H905 Unspecified sensorineural hearing loss: Secondary | ICD-10-CM | POA: Diagnosis not present

## 2019-05-19 DIAGNOSIS — Z5321 Procedure and treatment not carried out due to patient leaving prior to being seen by health care provider: Secondary | ICD-10-CM | POA: Insufficient documentation

## 2019-05-19 DIAGNOSIS — Z4889 Encounter for other specified surgical aftercare: Secondary | ICD-10-CM | POA: Diagnosis not present

## 2019-05-19 DIAGNOSIS — Z9621 Cochlear implant status: Secondary | ICD-10-CM | POA: Diagnosis not present

## 2019-05-19 LAB — URINALYSIS, ROUTINE W REFLEX MICROSCOPIC
Bilirubin Urine: NEGATIVE
Glucose, UA: NEGATIVE mg/dL
Ketones, ur: NEGATIVE mg/dL
Leukocytes,Ua: NEGATIVE
Nitrite: NEGATIVE
Protein, ur: NEGATIVE mg/dL
Specific Gravity, Urine: 1.008 (ref 1.005–1.030)
pH: 7 (ref 5.0–8.0)

## 2019-05-19 NOTE — ED Triage Notes (Signed)
Patient expressed relief after foley catheter insertion .

## 2019-05-19 NOTE — ED Triage Notes (Addendum)
Patient reports urinary retention with bladder pressure onset last night . Bladder Scan = 800 ml.

## 2019-05-19 NOTE — ED Notes (Signed)
Patient left without being seen.

## 2019-05-31 DIAGNOSIS — R330 Drug induced retention of urine: Secondary | ICD-10-CM | POA: Diagnosis not present

## 2019-06-01 DIAGNOSIS — R330 Drug induced retention of urine: Secondary | ICD-10-CM | POA: Diagnosis not present

## 2019-06-15 DIAGNOSIS — R330 Drug induced retention of urine: Secondary | ICD-10-CM | POA: Diagnosis not present

## 2019-06-17 DIAGNOSIS — R330 Drug induced retention of urine: Secondary | ICD-10-CM | POA: Diagnosis not present

## 2019-07-04 DIAGNOSIS — Z45321 Encounter for adjustment and management of cochlear device: Secondary | ICD-10-CM | POA: Diagnosis not present

## 2019-07-04 DIAGNOSIS — H903 Sensorineural hearing loss, bilateral: Secondary | ICD-10-CM | POA: Diagnosis not present

## 2019-08-03 DIAGNOSIS — R311 Benign essential microscopic hematuria: Secondary | ICD-10-CM | POA: Diagnosis not present

## 2019-09-12 DIAGNOSIS — H903 Sensorineural hearing loss, bilateral: Secondary | ICD-10-CM | POA: Diagnosis not present

## 2019-09-12 DIAGNOSIS — Z45321 Encounter for adjustment and management of cochlear device: Secondary | ICD-10-CM | POA: Diagnosis not present

## 2019-11-23 DIAGNOSIS — Z23 Encounter for immunization: Secondary | ICD-10-CM | POA: Diagnosis not present

## 2019-12-13 DIAGNOSIS — H903 Sensorineural hearing loss, bilateral: Secondary | ICD-10-CM | POA: Diagnosis not present

## 2019-12-13 DIAGNOSIS — Z45321 Encounter for adjustment and management of cochlear device: Secondary | ICD-10-CM | POA: Diagnosis not present

## 2020-01-09 DIAGNOSIS — E785 Hyperlipidemia, unspecified: Secondary | ICD-10-CM | POA: Diagnosis not present

## 2020-01-09 DIAGNOSIS — Z125 Encounter for screening for malignant neoplasm of prostate: Secondary | ICD-10-CM | POA: Diagnosis not present

## 2020-01-09 DIAGNOSIS — R739 Hyperglycemia, unspecified: Secondary | ICD-10-CM | POA: Diagnosis not present

## 2020-01-16 DIAGNOSIS — R0600 Dyspnea, unspecified: Secondary | ICD-10-CM | POA: Diagnosis not present

## 2020-01-16 DIAGNOSIS — M25512 Pain in left shoulder: Secondary | ICD-10-CM | POA: Diagnosis not present

## 2020-01-16 DIAGNOSIS — Z0000 Encounter for general adult medical examination without abnormal findings: Secondary | ICD-10-CM | POA: Diagnosis not present

## 2020-01-16 DIAGNOSIS — H903 Sensorineural hearing loss, bilateral: Secondary | ICD-10-CM | POA: Diagnosis not present

## 2020-01-16 DIAGNOSIS — Z1331 Encounter for screening for depression: Secondary | ICD-10-CM | POA: Diagnosis not present

## 2020-01-16 DIAGNOSIS — G8929 Other chronic pain: Secondary | ICD-10-CM | POA: Diagnosis not present

## 2020-01-16 DIAGNOSIS — M25511 Pain in right shoulder: Secondary | ICD-10-CM | POA: Diagnosis not present

## 2020-01-16 DIAGNOSIS — I2510 Atherosclerotic heart disease of native coronary artery without angina pectoris: Secondary | ICD-10-CM | POA: Diagnosis not present

## 2020-01-16 DIAGNOSIS — Z23 Encounter for immunization: Secondary | ICD-10-CM | POA: Diagnosis not present

## 2020-01-16 DIAGNOSIS — M6281 Muscle weakness (generalized): Secondary | ICD-10-CM | POA: Diagnosis not present

## 2020-01-16 DIAGNOSIS — R739 Hyperglycemia, unspecified: Secondary | ICD-10-CM | POA: Diagnosis not present

## 2020-01-17 DIAGNOSIS — R82998 Other abnormal findings in urine: Secondary | ICD-10-CM | POA: Diagnosis not present

## 2020-01-17 DIAGNOSIS — Z1212 Encounter for screening for malignant neoplasm of rectum: Secondary | ICD-10-CM | POA: Diagnosis not present

## 2020-01-23 DIAGNOSIS — R330 Drug induced retention of urine: Secondary | ICD-10-CM | POA: Diagnosis not present

## 2020-03-02 DIAGNOSIS — M19012 Primary osteoarthritis, left shoulder: Secondary | ICD-10-CM | POA: Diagnosis not present

## 2020-03-02 DIAGNOSIS — M19011 Primary osteoarthritis, right shoulder: Secondary | ICD-10-CM | POA: Diagnosis not present

## 2020-03-12 DIAGNOSIS — M19011 Primary osteoarthritis, right shoulder: Secondary | ICD-10-CM | POA: Diagnosis not present

## 2020-03-12 DIAGNOSIS — M19012 Primary osteoarthritis, left shoulder: Secondary | ICD-10-CM | POA: Diagnosis not present

## 2020-03-12 DIAGNOSIS — M6281 Muscle weakness (generalized): Secondary | ICD-10-CM | POA: Diagnosis not present

## 2020-04-25 DIAGNOSIS — L812 Freckles: Secondary | ICD-10-CM | POA: Diagnosis not present

## 2020-04-25 DIAGNOSIS — L821 Other seborrheic keratosis: Secondary | ICD-10-CM | POA: Diagnosis not present

## 2020-04-25 DIAGNOSIS — D1801 Hemangioma of skin and subcutaneous tissue: Secondary | ICD-10-CM | POA: Diagnosis not present

## 2020-04-25 DIAGNOSIS — L570 Actinic keratosis: Secondary | ICD-10-CM | POA: Diagnosis not present

## 2020-07-24 DIAGNOSIS — H903 Sensorineural hearing loss, bilateral: Secondary | ICD-10-CM | POA: Diagnosis not present

## 2020-07-24 DIAGNOSIS — Z9621 Cochlear implant status: Secondary | ICD-10-CM | POA: Diagnosis not present

## 2020-07-27 ENCOUNTER — Emergency Department (HOSPITAL_COMMUNITY): Payer: Medicare Other

## 2020-07-27 ENCOUNTER — Other Ambulatory Visit: Payer: Self-pay

## 2020-07-27 ENCOUNTER — Encounter (HOSPITAL_COMMUNITY): Payer: Self-pay | Admitting: Emergency Medicine

## 2020-07-27 ENCOUNTER — Observation Stay (HOSPITAL_COMMUNITY)
Admission: EM | Admit: 2020-07-27 | Discharge: 2020-07-29 | Disposition: A | Discharge status: Home / Self Care | Payer: Medicare Other | Attending: General Surgery | Admitting: General Surgery

## 2020-07-27 DIAGNOSIS — Y92410 Unspecified street and highway as the place of occurrence of the external cause: Secondary | ICD-10-CM | POA: Diagnosis not present

## 2020-07-27 DIAGNOSIS — I5042 Chronic combined systolic (congestive) and diastolic (congestive) heart failure: Secondary | ICD-10-CM | POA: Diagnosis not present

## 2020-07-27 DIAGNOSIS — Z20822 Contact with and (suspected) exposure to covid-19: Secondary | ICD-10-CM | POA: Insufficient documentation

## 2020-07-27 DIAGNOSIS — Z7982 Long term (current) use of aspirin: Secondary | ICD-10-CM | POA: Insufficient documentation

## 2020-07-27 DIAGNOSIS — R001 Bradycardia, unspecified: Secondary | ICD-10-CM | POA: Diagnosis not present

## 2020-07-27 DIAGNOSIS — Z79899 Other long term (current) drug therapy: Secondary | ICD-10-CM | POA: Insufficient documentation

## 2020-07-27 DIAGNOSIS — I700 Atherosclerosis of aorta: Secondary | ICD-10-CM | POA: Diagnosis not present

## 2020-07-27 DIAGNOSIS — S2220XA Unspecified fracture of sternum, initial encounter for closed fracture: Principal | ICD-10-CM | POA: Diagnosis present

## 2020-07-27 DIAGNOSIS — S299XXA Unspecified injury of thorax, initial encounter: Secondary | ICD-10-CM | POA: Diagnosis present

## 2020-07-27 DIAGNOSIS — S2500XA Unspecified injury of thoracic aorta, initial encounter: Secondary | ICD-10-CM | POA: Diagnosis not present

## 2020-07-27 DIAGNOSIS — S20219A Contusion of unspecified front wall of thorax, initial encounter: Secondary | ICD-10-CM | POA: Diagnosis not present

## 2020-07-27 DIAGNOSIS — I7101 Dissection of ascending aorta: Secondary | ICD-10-CM | POA: Diagnosis present

## 2020-07-27 DIAGNOSIS — R2681 Unsteadiness on feet: Secondary | ICD-10-CM | POA: Insufficient documentation

## 2020-07-27 DIAGNOSIS — J9811 Atelectasis: Secondary | ICD-10-CM | POA: Diagnosis not present

## 2020-07-27 DIAGNOSIS — R079 Chest pain, unspecified: Secondary | ICD-10-CM | POA: Diagnosis not present

## 2020-07-27 LAB — CBC WITH DIFFERENTIAL/PLATELET
Abs Immature Granulocytes: 0.04 10*3/uL (ref 0.00–0.07)
Basophils Absolute: 0.1 10*3/uL (ref 0.0–0.1)
Basophils Relative: 1 %
Eosinophils Absolute: 0 10*3/uL (ref 0.0–0.5)
Eosinophils Relative: 0 %
HCT: 47.6 % (ref 39.0–52.0)
Hemoglobin: 15.5 g/dL (ref 13.0–17.0)
Immature Granulocytes: 0 %
Lymphocytes Relative: 8 %
Lymphs Abs: 0.9 10*3/uL (ref 0.7–4.0)
MCH: 32.6 pg (ref 26.0–34.0)
MCHC: 32.6 g/dL (ref 30.0–36.0)
MCV: 100.2 fL — ABNORMAL HIGH (ref 80.0–100.0)
Monocytes Absolute: 0.5 10*3/uL (ref 0.1–1.0)
Monocytes Relative: 5 %
Neutro Abs: 8.7 10*3/uL — ABNORMAL HIGH (ref 1.7–7.7)
Neutrophils Relative %: 86 %
Platelets: 147 10*3/uL — ABNORMAL LOW (ref 150–400)
RBC: 4.75 MIL/uL (ref 4.22–5.81)
RDW: 12.2 % (ref 11.5–15.5)
WBC: 10.1 10*3/uL (ref 4.0–10.5)
nRBC: 0 % (ref 0.0–0.2)

## 2020-07-27 LAB — COMPREHENSIVE METABOLIC PANEL
ALT: 18 U/L (ref 0–44)
AST: 22 U/L (ref 15–41)
Albumin: 4 g/dL (ref 3.5–5.0)
Alkaline Phosphatase: 40 U/L (ref 38–126)
Anion gap: 7 (ref 5–15)
BUN: 28 mg/dL — ABNORMAL HIGH (ref 8–23)
CO2: 26 mmol/L (ref 22–32)
Calcium: 8.9 mg/dL (ref 8.9–10.3)
Chloride: 102 mmol/L (ref 98–111)
Creatinine, Ser: 1.18 mg/dL (ref 0.61–1.24)
GFR, Estimated: >60 mL/min (ref >60)
Glucose, Bld: 155 mg/dL — ABNORMAL HIGH (ref 70–99)
Potassium: 4 mmol/L (ref 3.5–5.1)
Sodium: 135 mmol/L (ref 135–145)
Total Bilirubin: 0.7 mg/dL (ref 0.3–1.2)
Total Protein: 7.3 g/dL (ref 6.5–8.1)

## 2020-07-27 LAB — TROPONIN I (HIGH SENSITIVITY): Troponin I (High Sensitivity): 6 ng/L (ref <18)

## 2020-07-27 MED ORDER — SODIUM CHLORIDE 0.9 % IV BOLUS
1000.0000 mL | Freq: Once | INTRAVENOUS | Status: AC
  Administered 2020-07-27: 1000 mL via INTRAVENOUS

## 2020-07-27 MED ORDER — FENTANYL CITRATE (PF) 100 MCG/2ML IJ SOLN
50.0000 ug | Freq: Once | INTRAMUSCULAR | Status: AC
  Administered 2020-07-27: 50 ug via INTRAVENOUS
  Filled 2020-07-27: qty 2

## 2020-07-27 MED ORDER — IOHEXOL 300 MG/ML  SOLN
75.0000 mL | Freq: Once | INTRAMUSCULAR | Status: AC | PRN
  Administered 2020-07-28: 75 mL via INTRAVENOUS

## 2020-07-27 MED ORDER — SODIUM CHLORIDE (PF) 0.9 % IJ SOLN
INTRAMUSCULAR | Status: AC
  Filled 2020-07-27: qty 50
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
## 2020-07-27 NOTE — ED Notes (Signed)
Patient transported to X-ray 

## 2020-07-28 ENCOUNTER — Encounter (HOSPITAL_COMMUNITY): Payer: Self-pay

## 2020-07-28 DIAGNOSIS — R079 Chest pain, unspecified: Secondary | ICD-10-CM | POA: Diagnosis not present

## 2020-07-28 DIAGNOSIS — Z20822 Contact with and (suspected) exposure to covid-19: Secondary | ICD-10-CM | POA: Diagnosis not present

## 2020-07-28 DIAGNOSIS — S2220XA Unspecified fracture of sternum, initial encounter for closed fracture: Secondary | ICD-10-CM | POA: Diagnosis not present

## 2020-07-28 DIAGNOSIS — I5042 Chronic combined systolic (congestive) and diastolic (congestive) heart failure: Secondary | ICD-10-CM | POA: Diagnosis not present

## 2020-07-28 DIAGNOSIS — Z79899 Other long term (current) drug therapy: Secondary | ICD-10-CM | POA: Diagnosis not present

## 2020-07-28 DIAGNOSIS — I7101 Dissection of ascending aorta: Secondary | ICD-10-CM | POA: Diagnosis present

## 2020-07-28 DIAGNOSIS — I700 Atherosclerosis of aorta: Secondary | ICD-10-CM | POA: Diagnosis not present

## 2020-07-28 DIAGNOSIS — Y92410 Unspecified street and highway as the place of occurrence of the external cause: Secondary | ICD-10-CM | POA: Diagnosis not present

## 2020-07-28 DIAGNOSIS — J9811 Atelectasis: Secondary | ICD-10-CM | POA: Diagnosis not present

## 2020-07-28 DIAGNOSIS — Z7982 Long term (current) use of aspirin: Secondary | ICD-10-CM | POA: Diagnosis not present

## 2020-07-28 DIAGNOSIS — S299XXA Unspecified injury of thorax, initial encounter: Secondary | ICD-10-CM | POA: Diagnosis present

## 2020-07-28 DIAGNOSIS — S2500XA Unspecified injury of thoracic aorta, initial encounter: Secondary | ICD-10-CM | POA: Diagnosis not present

## 2020-07-28 LAB — GLUCOSE, CAPILLARY: Glucose-Capillary: 182 mg/dL — ABNORMAL HIGH (ref 70–99)

## 2020-07-28 LAB — SARS CORONAVIRUS 2 (TAT 6-24 HRS): SARS Coronavirus 2: NEGATIVE

## 2020-07-28 LAB — TROPONIN I (HIGH SENSITIVITY)
Troponin I (High Sensitivity): 7 ng/L (ref <18)
Troponin I (High Sensitivity): 14 ng/L (ref <18)

## 2020-07-28 LAB — BASIC METABOLIC PANEL
Anion gap: 6 (ref 5–15)
Anion gap: 5 (ref 5–15)
BUN: 22 mg/dL (ref 8–23)
BUN: 13 mg/dL (ref 8–23)
CO2: 27 mmol/L (ref 22–32)
CO2: 29 mmol/L (ref 22–32)
Calcium: 8.2 mg/dL — ABNORMAL LOW (ref 8.9–10.3)
Calcium: 8.1 mg/dL — ABNORMAL LOW (ref 8.9–10.3)
Chloride: 104 mmol/L (ref 98–111)
Chloride: 104 mmol/L (ref 98–111)
Creatinine, Ser: 1.16 mg/dL (ref 0.61–1.24)
Creatinine, Ser: 1.05 mg/dL (ref 0.61–1.24)
GFR, Estimated: >60 mL/min (ref >60)
GFR, Estimated: >60 mL/min (ref >60)
Glucose, Bld: 127 mg/dL — ABNORMAL HIGH (ref 70–99)
Glucose, Bld: 116 mg/dL — ABNORMAL HIGH (ref 70–99)
Potassium: 4.5 mmol/L (ref 3.5–5.1)
Potassium: 4 mmol/L (ref 3.5–5.1)
Sodium: 137 mmol/L (ref 135–145)
Sodium: 138 mmol/L (ref 135–145)

## 2020-07-28 LAB — CBC
HCT: 42.9 % (ref 39.0–52.0)
Hemoglobin: 14.4 g/dL (ref 13.0–17.0)
MCH: 33.4 pg (ref 26.0–34.0)
MCHC: 33.6 g/dL (ref 30.0–36.0)
MCV: 99.5 fL (ref 80.0–100.0)
Platelets: 142 10*3/uL — ABNORMAL LOW (ref 150–400)
RBC: 4.31 MIL/uL (ref 4.22–5.81)
RDW: 12.3 % (ref 11.5–15.5)
WBC: 6.4 10*3/uL (ref 4.0–10.5)
nRBC: 0 % (ref 0.0–0.2)

## 2020-07-28 LAB — MAGNESIUM: Magnesium: 2 mg/dL (ref 1.7–2.4)

## 2020-07-28 LAB — PHOSPHORUS: Phosphorus: 2.8 mg/dL (ref 2.5–4.6)

## 2020-07-28 LAB — RESP PANEL BY RT-PCR (FLU A&B, COVID) ARPGX2
Influenza A by PCR: NEGATIVE
Influenza B by PCR: NEGATIVE
SARS Coronavirus 2 by RT PCR: NEGATIVE

## 2020-07-28 MED ORDER — IBUPROFEN 200 MG PO TABS
800.0000 mg | ORAL_TABLET | Freq: Three times a day (TID) | ORAL | Status: DC
  Administered 2020-07-28 – 2020-07-29 (×3): 800 mg via ORAL
  Filled 2020-07-28: qty 4
  Filled 2020-07-28: qty 1
  Filled 2020-07-28: qty 4

## 2020-07-28 MED ORDER — HYDROMORPHONE HCL 1 MG/ML IJ SOLN
0.5000 mg | INTRAMUSCULAR | Status: DC | PRN
  Administered 2020-07-28 (×2): 0.5 mg via INTRAVENOUS
  Filled 2020-07-28 (×2): qty 1

## 2020-07-28 MED ORDER — FENTANYL CITRATE (PF) 100 MCG/2ML IJ SOLN
50.0000 ug | Freq: Once | INTRAMUSCULAR | Status: AC
  Administered 2020-07-28: 50 ug via INTRAVENOUS
  Filled 2020-07-28: qty 2

## 2020-07-28 MED ORDER — ONDANSETRON 4 MG PO TBDP: 4.0000 mg | ORAL_TABLET | Freq: Four times a day (QID) | ORAL | Status: DC | PRN

## 2020-07-28 MED ORDER — SODIUM CHLORIDE 0.9 % IV SOLN: INTRAVENOUS | Status: DC

## 2020-07-28 MED ORDER — OXYCODONE HCL 5 MG PO TABS: 5.0000 mg | ORAL_TABLET | ORAL | Status: DC | PRN

## 2020-07-28 MED ORDER — DOCUSATE SODIUM 100 MG PO CAPS
100.0000 mg | ORAL_CAPSULE | Freq: Two times a day (BID) | ORAL | Status: DC
  Administered 2020-07-28 – 2020-07-29 (×3): 100 mg via ORAL
  Filled 2020-07-28 (×3): qty 1

## 2020-07-28 MED ORDER — ONDANSETRON HCL 4 MG/2ML IJ SOLN
4.0000 mg | Freq: Four times a day (QID) | INTRAMUSCULAR | Status: DC | PRN
  Administered 2020-07-28: 4 mg via INTRAVENOUS
  Filled 2020-07-28: qty 2

## 2020-07-28 MED ORDER — ACETAMINOPHEN 325 MG PO TABS
650.0000 mg | ORAL_TABLET | Freq: Four times a day (QID) | ORAL | Status: DC
  Administered 2020-07-28 – 2020-07-29 (×6): 650 mg via ORAL
  Filled 2020-07-28 (×6): qty 2

## 2020-07-28 MED ORDER — ENOXAPARIN SODIUM 30 MG/0.3ML IJ SOSY: 30.0000 mg | PREFILLED_SYRINGE | Freq: Two times a day (BID) | INTRAMUSCULAR | Status: DC

## 2020-07-28 MED ORDER — PANTOPRAZOLE SODIUM 40 MG PO TBEC
40.0000 mg | DELAYED_RELEASE_TABLET | Freq: Every day | ORAL | Status: DC
  Administered 2020-07-28 – 2020-07-29 (×2): 40 mg via ORAL
  Filled 2020-07-28 (×2): qty 1

## 2020-07-28 MED ORDER — METHOCARBAMOL 500 MG PO TABS
500.0000 mg | ORAL_TABLET | Freq: Four times a day (QID) | ORAL | Status: DC | PRN
  Administered 2020-07-28 – 2020-07-29 (×3): 500 mg via ORAL
  Filled 2020-07-28 (×3): qty 1

## 2020-07-28 NOTE — ED Notes (Signed)
Spoke to lab regarding covid swab pending. Currently in process per lab.

## 2020-07-28 NOTE — ED Notes (Signed)
Called and gave report to Magazine features editor at [facility]. [facility] called and will be here in 43min.

## 2020-07-28 NOTE — ED Notes (Addendum)
Trauma RN, Santiago Glad is aware that pt is here. Coordinating with trauma surgeon. Pt is admitted.
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
## 2020-07-28 NOTE — ED Notes (Signed)
Pt is alert and oriented x 4. Reports 2/10 pain scale to sternal area and 7-8 pain scale to bilateral shoulders. Reports severe arthritis to both shoulders. Pt is talking in full sentences. Appears comfortable at this time. O2 at 2LPM initiated for comfort. Pt O2 dropped to 85% after giving dilaudid. Pt is neuro intact. O2 increased to 99% after 2LPM O2 [place] given. Will continue to monitor.
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
## 2020-07-28 NOTE — ED Notes (Signed)
Pt reports 5/10 pain scale to mid chest but requested pain meds. Reports pain is worse on movement. Alert and oriented x 4. Will continue to monitor.

## 2020-07-29 ENCOUNTER — Observation Stay (HOSPITAL_COMMUNITY): Payer: Medicare Other

## 2020-07-29 DIAGNOSIS — S2500XA Unspecified injury of thoracic aorta, initial encounter: Secondary | ICD-10-CM | POA: Diagnosis not present

## 2020-07-29 DIAGNOSIS — S2220XA Unspecified fracture of sternum, initial encounter for closed fracture: Secondary | ICD-10-CM | POA: Diagnosis not present

## 2020-07-29 DIAGNOSIS — I5042 Chronic combined systolic (congestive) and diastolic (congestive) heart failure: Secondary | ICD-10-CM | POA: Diagnosis not present

## 2020-07-29 LAB — MRSA NEXT GEN BY PCR, NASAL: MRSA by PCR Next Gen: NOT DETECTED

## 2020-07-29 MED ORDER — METHOCARBAMOL 500 MG PO TABS: 500.0000 mg | ORAL_TABLET | Freq: Four times a day (QID) | ORAL | 0 refills | Status: DC | PRN

## 2020-07-29 MED ORDER — OXYCODONE HCL 5 MG PO TABS: 5.0000 mg | ORAL_TABLET | ORAL | 0 refills | Status: DC | PRN
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
## 2020-07-29 NOTE — Plan of Care (Signed)
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
## 2020-08-16 DIAGNOSIS — L570 Actinic keratosis: Secondary | ICD-10-CM | POA: Diagnosis not present

## 2020-08-20 DIAGNOSIS — I429 Cardiomyopathy, unspecified: Secondary | ICD-10-CM | POA: Diagnosis not present

## 2020-08-20 DIAGNOSIS — I2510 Atherosclerotic heart disease of native coronary artery without angina pectoris: Secondary | ICD-10-CM | POA: Diagnosis not present

## 2020-08-20 DIAGNOSIS — K219 Gastro-esophageal reflux disease without esophagitis: Secondary | ICD-10-CM | POA: Diagnosis not present

## 2020-08-20 DIAGNOSIS — R739 Hyperglycemia, unspecified: Secondary | ICD-10-CM | POA: Diagnosis not present

## 2020-08-20 DIAGNOSIS — H903 Sensorineural hearing loss, bilateral: Secondary | ICD-10-CM | POA: Diagnosis not present

## 2020-08-20 DIAGNOSIS — M25511 Pain in right shoulder: Secondary | ICD-10-CM | POA: Diagnosis not present

## 2020-08-24 ENCOUNTER — Ambulatory Visit: Payer: TRICARE For Life (TFL) | Admitting: Thoracic Surgery (Cardiothoracic Vascular Surgery)

## 2020-08-30 ENCOUNTER — Other Ambulatory Visit: Payer: Self-pay | Admitting: Thoracic Surgery (Cardiothoracic Vascular Surgery)

## 2020-08-30 DIAGNOSIS — S2220XK Unspecified fracture of sternum, subsequent encounter for fracture with nonunion: Secondary | ICD-10-CM

## 2020-08-31 ENCOUNTER — Ambulatory Visit (INDEPENDENT_AMBULATORY_CARE_PROVIDER_SITE_OTHER): Payer: Medicare Other | Admitting: Thoracic Surgery (Cardiothoracic Vascular Surgery)

## 2020-08-31 ENCOUNTER — Other Ambulatory Visit: Payer: Self-pay

## 2020-08-31 ENCOUNTER — Ambulatory Visit
Admission: RE | Admit: 2020-08-31 | Discharge: 2020-08-31 | Disposition: A | Discharge status: Home / Self Care | Payer: Medicare Other | Source: Ambulatory Visit | Attending: Thoracic Surgery (Cardiothoracic Vascular Surgery) | Admitting: Thoracic Surgery (Cardiothoracic Vascular Surgery)

## 2020-08-31 VITALS — BP 133/78 | HR 60 | Resp 20 | Ht 71.0 in | Wt 164.0 lb

## 2020-08-31 DIAGNOSIS — S2220XK Unspecified fracture of sternum, subsequent encounter for fracture with nonunion: Secondary | ICD-10-CM

## 2020-08-31 DIAGNOSIS — I700 Atherosclerosis of aorta: Secondary | ICD-10-CM | POA: Diagnosis not present

## 2020-08-31 DIAGNOSIS — S2220XA Unspecified fracture of sternum, initial encounter for closed fracture: Secondary | ICD-10-CM | POA: Diagnosis not present
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
## 2021-02-25 DIAGNOSIS — R739 Hyperglycemia, unspecified: Secondary | ICD-10-CM | POA: Diagnosis not present

## 2021-02-25 DIAGNOSIS — Z125 Encounter for screening for malignant neoplasm of prostate: Secondary | ICD-10-CM | POA: Diagnosis not present

## 2021-02-25 DIAGNOSIS — E785 Hyperlipidemia, unspecified: Secondary | ICD-10-CM | POA: Diagnosis not present

## 2021-02-25 DIAGNOSIS — I2510 Atherosclerotic heart disease of native coronary artery without angina pectoris: Secondary | ICD-10-CM | POA: Diagnosis not present

## 2021-03-04 DIAGNOSIS — I7101 Dissection of ascending aorta: Secondary | ICD-10-CM | POA: Diagnosis not present

## 2021-03-04 DIAGNOSIS — H903 Sensorineural hearing loss, bilateral: Secondary | ICD-10-CM | POA: Diagnosis not present

## 2021-03-04 DIAGNOSIS — Z0000 Encounter for general adult medical examination without abnormal findings: Secondary | ICD-10-CM | POA: Diagnosis not present

## 2021-03-04 DIAGNOSIS — I2510 Atherosclerotic heart disease of native coronary artery without angina pectoris: Secondary | ICD-10-CM | POA: Diagnosis not present

## 2021-03-04 DIAGNOSIS — R739 Hyperglycemia, unspecified: Secondary | ICD-10-CM | POA: Diagnosis not present

## 2021-03-04 DIAGNOSIS — E785 Hyperlipidemia, unspecified: Secondary | ICD-10-CM | POA: Diagnosis not present

## 2021-03-04 DIAGNOSIS — R0683 Snoring: Secondary | ICD-10-CM | POA: Diagnosis not present

## 2021-03-04 DIAGNOSIS — R82998 Other abnormal findings in urine: Secondary | ICD-10-CM | POA: Diagnosis not present

## 2021-03-04 DIAGNOSIS — M47812 Spondylosis without myelopathy or radiculopathy, cervical region: Secondary | ICD-10-CM | POA: Diagnosis not present

## 2021-03-27 DIAGNOSIS — R1031 Right lower quadrant pain: Secondary | ICD-10-CM | POA: Diagnosis not present

## 2021-04-03 ENCOUNTER — Other Ambulatory Visit (HOSPITAL_COMMUNITY): Payer: Self-pay | Admitting: Internal Medicine

## 2021-04-03 ENCOUNTER — Encounter (HOSPITAL_BASED_OUTPATIENT_CLINIC_OR_DEPARTMENT_OTHER): Payer: Self-pay

## 2021-04-03 ENCOUNTER — Ambulatory Visit (HOSPITAL_BASED_OUTPATIENT_CLINIC_OR_DEPARTMENT_OTHER)
Admission: RE | Admit: 2021-04-03 | Discharge: 2021-04-03 | Disposition: A | Discharge status: Home / Self Care | Payer: Medicare Other | Source: Ambulatory Visit | Attending: Internal Medicine | Admitting: Internal Medicine

## 2021-04-03 ENCOUNTER — Other Ambulatory Visit: Payer: Self-pay

## 2021-04-03 DIAGNOSIS — R1031 Right lower quadrant pain: Secondary | ICD-10-CM | POA: Diagnosis not present

## 2021-04-03 DIAGNOSIS — I700 Atherosclerosis of aorta: Secondary | ICD-10-CM | POA: Diagnosis not present

## 2021-04-03 DIAGNOSIS — R109 Unspecified abdominal pain: Secondary | ICD-10-CM | POA: Diagnosis not present

## 2021-04-03 LAB — POCT I-STAT CREATININE: Creatinine, Ser: 1.3 mg/dL — ABNORMAL HIGH (ref 0.61–1.24)

## 2021-04-03 MED ORDER — IOHEXOL 300 MG/ML  SOLN
100.0000 mL | Freq: Once | INTRAMUSCULAR | Status: AC | PRN
  Administered 2021-04-03: 80 mL via INTRAVENOUS

## 2021-04-26 DIAGNOSIS — M4692 Unspecified inflammatory spondylopathy, cervical region: Secondary | ICD-10-CM | POA: Diagnosis not present

## 2021-04-26 DIAGNOSIS — S46811A Strain of other muscles, fascia and tendons at shoulder and upper arm level, right arm, initial encounter: Secondary | ICD-10-CM | POA: Diagnosis not present

## 2021-04-30 DIAGNOSIS — R1031 Right lower quadrant pain: Secondary | ICD-10-CM | POA: Diagnosis not present

## 2021-04-30 DIAGNOSIS — K4090 Unilateral inguinal hernia, without obstruction or gangrene, not specified as recurrent: Secondary | ICD-10-CM | POA: Diagnosis not present

## 2021-05-01 DIAGNOSIS — L820 Inflamed seborrheic keratosis: Secondary | ICD-10-CM | POA: Diagnosis not present

## 2021-05-01 DIAGNOSIS — D225 Melanocytic nevi of trunk: Secondary | ICD-10-CM | POA: Diagnosis not present

## 2021-05-01 DIAGNOSIS — L821 Other seborrheic keratosis: Secondary | ICD-10-CM | POA: Diagnosis not present

## 2021-05-10 DIAGNOSIS — S46811D Strain of other muscles, fascia and tendons at shoulder and upper arm level, right arm, subsequent encounter: Secondary | ICD-10-CM | POA: Diagnosis not present

## 2021-05-22 DIAGNOSIS — S46811D Strain of other muscles, fascia and tendons at shoulder and upper arm level, right arm, subsequent encounter: Secondary | ICD-10-CM | POA: Diagnosis not present

## 2021-05-23 DIAGNOSIS — S46811D Strain of other muscles, fascia and tendons at shoulder and upper arm level, right arm, subsequent encounter: Secondary | ICD-10-CM | POA: Diagnosis not present

## 2021-07-01 ENCOUNTER — Ambulatory Visit: Payer: Self-pay | Admitting: General Surgery

## 2021-07-01 DIAGNOSIS — K4090 Unilateral inguinal hernia, without obstruction or gangrene, not specified as recurrent: Secondary | ICD-10-CM | POA: Diagnosis not present

## 2021-07-01 NOTE — H&P (Signed)
Chief Complaint: New Consultation (Inguinal hernia)       History of Present Illness: Jaime Gates is a 88 y.o. male who is seen today as an office consultation at the request of Dr. Milinda Pointer for evaluation of New Consultation (Inguinal hernia) .   Patient is an 88 year old male, with a history of a right inguinal hernia.  He states this been there since March of this year.  He states he has noticed a bulge.  He states that it does reduce on its own.  He states that he has had no signs or symptoms of incarceration or strangulation.  Patient is otherwise active, still works at a [employer].   Patient had no previous abdominal surgery.     Review of Systems: A complete review of systems was obtained from the patient.  I have reviewed this information and discussed as appropriate with the patient.  See HPI as well for other ROS.   Review of Systems  Constitutional:  Negative for fever.  HENT:  Negative for congestion.   Eyes:  Negative for blurred vision.  Respiratory:  Negative for cough, shortness of breath and wheezing.   Cardiovascular:  Negative for chest pain and palpitations.  Gastrointestinal:  Negative for heartburn.  Genitourinary:  Negative for dysuria.  Musculoskeletal:  Negative for myalgias.  Skin:  Negative for rash.  Neurological:  Negative for dizziness and headaches.  Psychiatric/Behavioral:  Negative for depression and suicidal ideas.   All other systems reviewed and are negative.      Medical History: Past Medical History Past Medical History: Diagnosis Date  Arthritis        There is no problem list on file for this patient.     Past Surgical History Past Surgical History: Procedure Laterality Date  COCHLEAR IMPLANT Bilateral    ENDOSCOPIC CARPAL TUNNEL RELEASE          Allergies Allergies Allergen Reactions  Fluorescein Hives, Itching, Rash and Swelling      Current Outpatient Medications on File Prior to  Visit Medication Sig Dispense Refill  aspirin 81 MG EC tablet Take by mouth       No current facility-administered medications on file prior to visit.     Family History Family History Problem Relation Age of Onset  Coronary Artery Disease (Blocked arteries around heart) Mother    Diabetes Mother        Social History   Tobacco Use Smoking Status Never Smokeless Tobacco Never     Social History Social History    Socioeconomic History  Marital status: Married Tobacco Use  Smoking status: Never  Smokeless tobacco: Never Vaping Use  Vaping Use: Never used Substance and Sexual Activity  Alcohol use: Yes  Drug use: Never      Objective:     Vitals:   07/01/21 1029 BP: 90/60   There is no height or weight on file to calculate BMI. Physical Exam Constitutional:      Appearance: Normal appearance.  HENT:     Head: Normocephalic and atraumatic.     Nose: Nose normal. No congestion.     Mouth/Throat:     Mouth: Mucous membranes are moist.     Pharynx: Oropharynx is clear.  Eyes:     Pupils: Pupils are equal, round, and reactive to light.  Cardiovascular:     Rate and Rhythm: Normal rate and regular rhythm.     Pulses: Normal pulses.     Heart sounds: Normal heart sounds. No murmur  heard.   No friction rub. No gallop.  Pulmonary:     Effort: Pulmonary effort is normal. No respiratory distress.     Breath sounds: Normal breath sounds. No stridor. No wheezing, rhonchi or rales.  Abdominal:     General: Abdomen is flat.     Hernia: A hernia is present. Hernia is present in the right inguinal area.  Musculoskeletal:        General: Normal range of motion.     Cervical back: Normal range of motion.  Skin:    General: Skin is warm and dry.  Neurological:     General: No focal deficit present.     Mental Status: He is alert and oriented to person, place, and time.  Psychiatric:        Mood and Affect: Mood normal.        Thought Content: Thought content  normal.        Assessment and Plan: Diagnoses and all orders for this visit:   Non-recurrent unilateral inguinal hernia without obstruction or gangrene     Jaime Gates is a 88 y.o. male    1.  We will proceed to the OR for a laparoscopic right inguinal hernia repair with mesh. 2. All risks and benefits were discussed with the patient, to generally include infection, bleeding, damage to surrounding structures, acute and chronic nerve pain, and recurrence. Alternatives were offered and described.  All questions were answered and the patient voiced understanding of the procedure and wishes to proceed at this point.             No follow-ups on file.   Ralene Ok, MD, [facility], [facility]

## 2021-07-25 DIAGNOSIS — Z45321 Encounter for adjustment and management of cochlear device: Secondary | ICD-10-CM | POA: Diagnosis not present

## 2021-07-25 DIAGNOSIS — Z9621 Cochlear implant status: Secondary | ICD-10-CM | POA: Diagnosis not present

## 2021-07-25 DIAGNOSIS — Z4889 Encounter for other specified surgical aftercare: Secondary | ICD-10-CM | POA: Diagnosis not present

## 2021-07-25 DIAGNOSIS — H903 Sensorineural hearing loss, bilateral: Secondary | ICD-10-CM | POA: Diagnosis not present

## 2021-07-25 DIAGNOSIS — Z974 Presence of external hearing-aid: Secondary | ICD-10-CM | POA: Diagnosis not present

## 2021-07-25 DIAGNOSIS — Z9889 Other specified postprocedural states: Secondary | ICD-10-CM | POA: Diagnosis not present

## 2021-09-18 NOTE — Pre-Procedure Instructions (Signed)
Surgical Instructions    Your procedure is scheduled on Thursday 09/26/21.   Report to [facility] Main Entrance "A" at 05:30 A.M., then check in with the Admitting office.  Call this number if you have problems the morning of surgery:  [phone]   If you have any questions prior to your surgery date call [phone]: Open Monday-Friday 8am-4pm    Remember:  Do not eat after midnight the night before your surgery  You may drink clear liquids until 04:30 A.M. the morning of your surgery.   Clear liquids allowed are: Water, Non-Citrus Juices (without pulp), Carbonated Beverages, Clear Tea, Black Coffee ONLY (NO MILK, CREAM OR POWDERED CREAMER of any kind), and Gatorade  Patient Instructions  The night before surgery:  No food after midnight. ONLY clear liquids after midnight  The day of surgery (if you do NOT have diabetes):  Drink ONE (1) Pre-Surgery Clear Ensure by 04:30 A.M. the morning of surgery. Drink in one sitting. Do not sip.  This drink was given to you during your hospital  pre-op appointment visit.  Nothing else to drink after completing the  Pre-Surgery Clear Ensure.         If you have questions, please contact your surgeon's office.     Take these medicines the morning of surgery with A SIP OF WATER:   NONE  Please follow your surgeon's instructions regarding Aspirin. If no instructions were received, please contact your surgeon's office for instructions.   As of today, STOP taking any Aspirin (unless otherwise instructed by your surgeon) Aleve, Naproxen, Ibuprofen, Motrin, Advil, Goody's, BC's, all herbal medications, fish oil, and all vitamins.           Do not wear jewelry or makeup. Do not wear lotions, powders, perfumes/cologne or deodorant. Do not shave 48 hours prior to surgery.  Men may shave face and neck. Do not bring valuables to the hospital. Do not wear nail polish, gel polish, artificial nails, or any other type of covering on natural nails  (fingers and toes) If you have artificial nails or gel coating that need to be removed by a nail salon, please have this removed prior to surgery. Artificial nails or gel coating may interfere with anesthesia's ability to adequately monitor your vital signs.  [facility] is not responsible for any belongings or valuables.    Do NOT Smoke (Tobacco/Vaping)  24 hours prior to your procedure  If you use a CPAP at night, you may bring your mask for your overnight stay.   Contacts, glasses, hearing aids, dentures or partials may not be worn into surgery, please bring cases for these belongings   For patients admitted to the hospital, discharge time will be determined by your treatment team.   Patients discharged the day of surgery will not be allowed to drive home, and someone needs to stay with them for 24 hours.   SURGICAL WAITING ROOM VISITATION Patients having surgery or a procedure may have no more than 2 support people in the waiting area - these visitors may rotate.   Children under the age of 9 must have an adult with them who is not the patient. If the patient needs to stay at the hospital during part of their recovery, the visitor guidelines for inpatient rooms apply. Pre-op nurse will coordinate an appropriate time for 1 support person to accompany patient in pre-op.  This support person may not rotate.   Please refer to the [facility] website for the visitor guidelines for  Inpatients (after your surgery is over and you are in a regular room).    Special instructions:    Oral Hygiene is also important to reduce your risk of infection.  Remember - BRUSH YOUR TEETH THE MORNING OF SURGERY WITH YOUR REGULAR TOOTHPASTE   [facility]- Preparing For Surgery  Before surgery, you can play an important role. Because skin is not sterile, your skin needs to be as free of germs as possible. You can reduce the number of germs on your skin by washing with CHG (chlorahexidine gluconate) Soap  before surgery.  CHG is an antiseptic cleaner which kills germs and bonds with the skin to continue killing germs even after washing.     Please do not use if you have an allergy to CHG or antibacterial soaps. If your skin becomes reddened/irritated stop using the CHG.  Do not shave (including legs and underarms) for at least 48 hours prior to first CHG shower. It is OK to shave your face.  Please follow these instructions carefully.     Shower the NIGHT BEFORE SURGERY and the MORNING OF SURGERY with CHG Soap.   If you chose to wash your hair, wash your hair first as usual with your normal shampoo. After you shampoo, rinse your hair and body thoroughly to remove the shampoo.  Then [employer] and genitals (private parts) with your normal soap and rinse thoroughly to remove soap.  After that Use CHG Soap as you would any other liquid soap. You can apply CHG directly to the skin and wash gently with a scrungie or a clean washcloth.   Apply the CHG Soap to your body ONLY FROM THE NECK DOWN.  Do not use on open wounds or open sores. Avoid contact with your eyes, ears, mouth and genitals (private parts). Wash Face and genitals (private parts)  with your normal soap.   Wash thoroughly, paying special attention to the area where your surgery will be performed.  Thoroughly rinse your body with warm water from the neck down.  DO NOT shower/wash with your normal soap after using and rinsing off the CHG Soap.  Pat yourself dry with a CLEAN TOWEL.  Wear CLEAN PAJAMAS to bed the night before surgery  Place CLEAN SHEETS on your bed the night before your surgery  DO NOT SLEEP WITH PETS.   Day of Surgery:  Take a shower with CHG soap. Wear Clean/Comfortable clothing the morning of surgery Do not apply any deodorants/lotions.   Remember to brush your teeth WITH YOUR REGULAR TOOTHPASTE.    If you received a COVID test during your pre-op visit, it is requested that you wear a mask when out in  public, stay away from anyone that may not be feeling well, and notify your surgeon if you develop symptoms. If you have been in contact with anyone that has tested positive in the last 10 days, please notify your surgeon.    Please read over the following fact sheets that you were given.

## 2021-09-19 ENCOUNTER — Other Ambulatory Visit: Payer: Self-pay

## 2021-09-19 ENCOUNTER — Encounter (HOSPITAL_COMMUNITY): Payer: Self-pay

## 2021-09-19 ENCOUNTER — Encounter (HOSPITAL_COMMUNITY)
Admission: RE | Admit: 2021-09-19 | Discharge: 2021-09-19 | Disposition: A | Discharge status: Home / Self Care | Payer: Medicare Other | Source: Ambulatory Visit | Attending: General Surgery | Admitting: General Surgery

## 2021-09-19 VITALS — BP 139/79 | HR 55 | Temp 97.9°F | Resp 18 | Ht 70.0 in | Wt 163.8 lb

## 2021-09-19 DIAGNOSIS — I700 Atherosclerosis of aorta: Secondary | ICD-10-CM | POA: Diagnosis not present

## 2021-09-19 DIAGNOSIS — N400 Enlarged prostate without lower urinary tract symptoms: Secondary | ICD-10-CM | POA: Diagnosis not present

## 2021-09-19 DIAGNOSIS — K4090 Unilateral inguinal hernia, without obstruction or gangrene, not specified as recurrent: Secondary | ICD-10-CM | POA: Insufficient documentation

## 2021-09-19 DIAGNOSIS — M4316 Spondylolisthesis, lumbar region: Secondary | ICD-10-CM | POA: Diagnosis not present

## 2021-09-19 DIAGNOSIS — M1990 Unspecified osteoarthritis, unspecified site: Secondary | ICD-10-CM

## 2021-09-19 DIAGNOSIS — Z01812 Encounter for preprocedural laboratory examination: Secondary | ICD-10-CM | POA: Insufficient documentation

## 2021-09-19 DIAGNOSIS — Z01818 Encounter for other preprocedural examination: Secondary | ICD-10-CM

## 2021-09-19 DIAGNOSIS — K219 Gastro-esophageal reflux disease without esophagitis: Secondary | ICD-10-CM | POA: Diagnosis not present

## 2021-09-19 HISTORY — DX: Bradycardia, unspecified: R00.1

## 2021-09-19 LAB — CBC
HCT: 49.3 % (ref 39.0–52.0)
Hemoglobin: 16.3 g/dL (ref 13.0–17.0)
MCH: 32.9 pg (ref 26.0–34.0)
MCHC: 33.1 g/dL (ref 30.0–36.0)
MCV: 99.4 fL (ref 80.0–100.0)
Platelets: 146 10*3/uL — ABNORMAL LOW (ref 150–400)
RBC: 4.96 MIL/uL (ref 4.22–5.81)
RDW: 12.3 % (ref 11.5–15.5)
WBC: 4.1 10*3/uL (ref 4.0–10.5)
nRBC: 0 % (ref 0.0–0.2)

## 2021-09-19 NOTE — Progress Notes (Signed)
PCP - Leanna Battles MD Cardiologist - >10 years ago  Chest x-ray - 08/31/21 EKG - n/a Stress Test - 12/18/11 ECHO - 1997 Cardiac Cath - 12/19/11  Sleep Study - denies  No diabetes  Please follow your surgeon's instructions regarding Aspirin. If no instructions were received, please contact your surgeon's office for instructions.    As of today, STOP taking any Aspirin (unless otherwise instructed by your surgeon) Aleve, Naproxen, Ibuprofen, Motrin, Advil, Goody's, BC's, all herbal medications, fish oil, and all vitamins.  ERAS Protcol - yes PRE-SURGERY Ensure or G2- ensure  COVID TEST- n/a   Anesthesia review: n/a  Patient denies shortness of breath, fever, cough and chest pain at PAT appointment   All instructions explained to the patient, with a verbal understanding of the material. Patient agrees to go over the instructions while at home for a better understanding. Patient also instructed to self quarantine after being tested for COVID-19. The opportunity to ask questions was provided.

## 2021-09-19 NOTE — Progress Notes (Signed)
PCP - Leanna Battles MD Cardiologist - >10 years ago  Chest x-ray - 08/31/21 EKG - n/a Stress Test - 12/18/11 ECHO - 1997 Cardiac Cath - 12/19/11  Sleep Study - denies  No diabetes     As of today, STOP taking any Aspirin (unless otherwise instructed by your surgeon) Aleve, Naproxen, Ibuprofen, Motrin, Advil, Goody's, BC's, all herbal medications, fish oil, and all vitamins.  ERAS Protcol - yes PRE-SURGERY Ensure or G2- ensure  COVID TEST- n/a   Anesthesia review: yes, cardiac history  Patient denies shortness of breath, fever, cough and chest pain at PAT appointment   All instructions explained to the patient, with a verbal understanding of the material. Patient agrees to go over the instructions while at home for a better understanding. Patient also instructed to self quarantine after being tested for COVID-19. The opportunity to ask questions was provided.

## 2021-09-20 ENCOUNTER — Encounter (HOSPITAL_COMMUNITY): Payer: Self-pay
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
## 2021-09-25 ENCOUNTER — Encounter (HOSPITAL_COMMUNITY): Payer: Self-pay

## 2021-09-25 NOTE — H&P (Signed)
Chief Complaint: New Consultation (Inguinal hernia)       History of Present Illness: Jaime Gates is a 88 y.o. male who is seen today as an office consultation at the request of Dr. Milinda Pointer for evaluation of New Consultation (Inguinal hernia) .   Patient is an 88 year old male, with a history of a right inguinal hernia.  He states this been there since March of this year.  He states he has noticed a bulge.  He states that it does reduce on its own.  He states that he has had no signs or symptoms of incarceration or strangulation.  Patient is otherwise active, still works at a [employer].   Patient had no previous abdominal surgery.     Review of Systems: A complete review of systems was obtained from the patient.  I have reviewed this information and discussed as appropriate with the patient.  See HPI as well for other ROS.   Review of Systems  Constitutional:  Negative for fever.  HENT:  Negative for congestion.   Eyes:  Negative for blurred vision.  Respiratory:  Negative for cough, shortness of breath and wheezing.   Cardiovascular:  Negative for chest pain and palpitations.  Gastrointestinal:  Negative for heartburn.  Genitourinary:  Negative for dysuria.  Musculoskeletal:  Negative for myalgias.  Skin:  Negative for rash.  Neurological:  Negative for dizziness and headaches.  Psychiatric/Behavioral:  Negative for depression and suicidal ideas.   All other systems reviewed and are negative.      Medical History: Past Medical History Past Medical History: Diagnosis        Date            Arthritis                    There is no problem list on file for this patient.     Past Surgical History Past Surgical History: Procedure       Laterality         Date            COCHLEAR IMPLANT           Bilateral                        ENDOSCOPIC CARPAL TUNNEL RELEASE                              Allergies Allergies Allergen           Reactions            Fluorescein      Hives, Itching, Rash and Swelling       Current Outpatient Medications on File Prior to Visit Medication       Sig       Dispense         Refill            aspirin 81 MG EC tablet         Take by mouth                            No current facility-administered medications on file prior to visit.     Family History Family History Problem           Relation           Age of Onset  Coronary Artery Disease (Blocked arteries around heart)     Mother              Diabetes          Mother         Social History   Tobacco Use Smoking Status           Never Smokeless Tobacco   Never     Social History Social History     Socioeconomic History            Marital status:  Married Tobacco Use            Smoking status:          Never            Smokeless tobacco:    Never Vaping Use            Vaping Use:    Never used Substance and Sexual Activity            Alcohol use:    Yes            Drug use:        Never       Objective:     Vitals:             07/01/21 1029 BP:      90/60   There is no height or weight on file to calculate BMI. Physical Exam Constitutional:      Appearance: Normal appearance.  HENT:     Head: Normocephalic and atraumatic.     Nose: Nose normal. No congestion.     Mouth/Throat:     Mouth: Mucous membranes are moist.     Pharynx: Oropharynx is clear.  Eyes:     Pupils: Pupils are equal, round, and reactive to light.  Cardiovascular:     Rate and Rhythm: Normal rate and regular rhythm.     Pulses: Normal pulses.     Heart sounds: Normal heart sounds. No murmur heard.   No friction rub. No gallop.  Pulmonary:     Effort: Pulmonary effort is normal. No respiratory distress.     Breath sounds: Normal breath sounds. No stridor. No wheezing, rhonchi or rales.  Abdominal:     General: Abdomen is flat.     Hernia: A hernia is present. Hernia is present in the right inguinal area.  Musculoskeletal:        General: Normal range  of motion.     Cervical back: Normal range of motion.  Skin:    General: Skin is warm and dry.  Neurological:     General: No focal deficit present.     Mental Status: He is alert and oriented to person, place, and time.  Psychiatric:        Mood and Affect: Mood normal.        Thought Content: Thought content normal.        Assessment and Plan: Diagnoses and all orders for this visit:   Non-recurrent unilateral inguinal hernia without obstruction or gangrene     Jaime Gates is a 88 y.o. male    1.          We will proceed to the OR for a laparoscopic right inguinal hernia repair with mesh. 2.         All risks and benefits were discussed with the patient, to generally include infection, bleeding, damage to surrounding structures, acute and chronic nerve pain, and  recurrence. Alternatives were offered and described.  All questions were answered and the patient voiced understanding of the procedure and wishes to proceed at this point.

## 2021-09-25 NOTE — Anesthesia Preprocedure Evaluation (Addendum)
Anesthesia Evaluation  Patient identified by MRN, date of birth, ID band Patient awake    Reviewed: Allergy & Precautions, NPO status , Patient's Chart, lab work & pertinent test results  Airway Mallampati: II  TM Distance: >3 FB Neck ROM: Full    Dental  (+) Dental Advisory Given   Pulmonary shortness of breath,    Pulmonary exam normal breath sounds clear to auscultation       Cardiovascular + CAD  + dysrhythmias Atrial Fibrillation  Rhythm:Regular Rate:Normal     Neuro/Psych negative neurological ROS     GI/Hepatic Neg liver ROS, GERD  ,  Endo/Other  negative endocrine ROS  Renal/GU negative Renal ROS     Musculoskeletal  (+) Arthritis ,   Abdominal   Peds  Hematology negative hematology ROS (+)   Anesthesia Other Findings   Reproductive/Obstetrics                          Anesthesia Physical Anesthesia Plan  ASA: 3  Anesthesia Plan: General   Post-op Pain Management: Tylenol PO (pre-op)* and Celebrex PO (pre-op)*   Induction: Intravenous  PONV Risk Score and Plan: 4 or greater and Ondansetron, Dexamethasone and Treatment may vary due to age or medical condition  Airway Management Planned: Oral ETT  Additional Equipment: ClearSight  Intra-op Plan:   Post-operative Plan: Extubation in OR  Informed Consent: I have reviewed the patients History and Physical, chart, labs and discussed the procedure including the risks, benefits and alternatives for the proposed anesthesia with the patient or authorized representative who has indicated his/her understanding and acceptance.     Dental advisory given  Plan Discussed with: CRNA  Anesthesia Plan Comments: (See PAT note written by Myra Gianotti, PA-C. I spoke with PCP Dr. Philip Aspen on 09/25/21. Mr. Casebeer with previous cardiac testing in 2013-2014. Known bradycardia, paroxsymal atrial tachycardia, and suspicion for undiagnosed OSA  based on nocturnal desaturations. Perioperative monitoring of rate/rhythm and O2 sat recommended. He needs updated EKG on arrival.  )      Anesthesia Quick Evaluation

## 2021-09-26 ENCOUNTER — Other Ambulatory Visit: Payer: Self-pay

## 2021-09-26 ENCOUNTER — Ambulatory Visit (HOSPITAL_COMMUNITY): Payer: Medicare Other | Admitting: Vascular Surgery

## 2021-09-26 ENCOUNTER — Ambulatory Visit (HOSPITAL_COMMUNITY)
Admission: RE | Admit: 2021-09-26 | Discharge: 2021-09-26 | Disposition: A | Discharge status: Home / Self Care | Payer: Medicare Other | Attending: General Surgery | Admitting: General Surgery

## 2021-09-26 ENCOUNTER — Ambulatory Visit (HOSPITAL_BASED_OUTPATIENT_CLINIC_OR_DEPARTMENT_OTHER): Payer: Medicare Other | Admitting: Vascular Surgery

## 2021-09-26 ENCOUNTER — Encounter (HOSPITAL_COMMUNITY)
Admission: RE | Disposition: A | Discharge status: Home / Self Care | Payer: Self-pay | Source: Home / Self Care | Attending: General Surgery

## 2021-09-26 ENCOUNTER — Encounter (HOSPITAL_COMMUNITY): Payer: Self-pay | Admitting: General Surgery

## 2021-09-26 DIAGNOSIS — I4891 Unspecified atrial fibrillation: Secondary | ICD-10-CM

## 2021-09-26 DIAGNOSIS — K4090 Unilateral inguinal hernia, without obstruction or gangrene, not specified as recurrent: Secondary | ICD-10-CM | POA: Diagnosis not present

## 2021-09-26 DIAGNOSIS — M1990 Unspecified osteoarthritis, unspecified site: Secondary | ICD-10-CM | POA: Diagnosis not present

## 2021-09-26 DIAGNOSIS — I2510 Atherosclerotic heart disease of native coronary artery without angina pectoris: Secondary | ICD-10-CM

## 2021-09-26 DIAGNOSIS — R330 Drug induced retention of urine: Secondary | ICD-10-CM | POA: Diagnosis not present

## 2021-09-26 HISTORY — PX: INGUINAL HERNIA REPAIR: SHX194

## 2021-09-26 SURGERY — REPAIR, HERNIA, INGUINAL, LAPAROSCOPIC
Anesthesia: General | Laterality: Right

## 2021-09-26 MED ORDER — PHENYLEPHRINE HCL (PRESSORS) 10 MG/ML IV SOLN
INTRAVENOUS | Status: DC | PRN
  Administered 2021-09-26: 80 ug via INTRAVENOUS

## 2021-09-26 MED ORDER — CEFAZOLIN SODIUM-DEXTROSE 2-4 GM/100ML-% IV SOLN
INTRAVENOUS | Status: AC
  Filled 2021-09-26: qty 100

## 2021-09-26 MED ORDER — CHLORHEXIDINE GLUCONATE CLOTH 2 % EX PADS: 6.0000 | MEDICATED_PAD | Freq: Once | CUTANEOUS | Status: DC

## 2021-09-26 MED ORDER — PHENYLEPHRINE HCL-NACL 20-0.9 MG/250ML-% IV SOLN
INTRAVENOUS | Status: DC | PRN
  Administered 2021-09-26: 25 ug/min via INTRAVENOUS

## 2021-09-26 MED ORDER — GLYCOPYRROLATE PF 0.2 MG/ML IJ SOSY
PREFILLED_SYRINGE | INTRAMUSCULAR | Status: DC | PRN
  Administered 2021-09-26: .2 mg via INTRAVENOUS

## 2021-09-26 MED ORDER — PROPOFOL 10 MG/ML IV BOLUS
INTRAVENOUS | Status: DC | PRN
  Administered 2021-09-26: 110 mg via INTRAVENOUS
  Administered 2021-09-26 (×2): 30 mg via INTRAVENOUS

## 2021-09-26 MED ORDER — MIDAZOLAM HCL 2 MG/2ML IJ SOLN
INTRAMUSCULAR | Status: AC
  Filled 2021-09-26: qty 2

## 2021-09-26 MED ORDER — SUGAMMADEX SODIUM 200 MG/2ML IV SOLN
INTRAVENOUS | Status: DC | PRN
  Administered 2021-09-26: 100 mg via INTRAVENOUS
  Administered 2021-09-26: 200 mg via INTRAVENOUS

## 2021-09-26 MED ORDER — ACETAMINOPHEN 500 MG PO TABS: 1000.0000 mg | ORAL_TABLET | ORAL | Status: AC

## 2021-09-26 MED ORDER — BUPIVACAINE HCL 0.25 % IJ SOLN
INTRAMUSCULAR | Status: DC | PRN
  Administered 2021-09-26: 30 mL

## 2021-09-26 MED ORDER — BUPIVACAINE HCL (PF) 0.25 % IJ SOLN
INTRAMUSCULAR | Status: AC
  Filled 2021-09-26: qty 30

## 2021-09-26 MED ORDER — 0.9 % SODIUM CHLORIDE (POUR BTL) OPTIME
TOPICAL | Status: DC | PRN
  Administered 2021-09-26: 1000 mL

## 2021-09-26 MED ORDER — EPHEDRINE SULFATE (PRESSORS) 50 MG/ML IJ SOLN
INTRAMUSCULAR | Status: DC | PRN
  Administered 2021-09-26: 5 mg via INTRAVENOUS
  Administered 2021-09-26: 10 mg via INTRAVENOUS

## 2021-09-26 MED ORDER — CHLORHEXIDINE GLUCONATE 0.12 % MT SOLN: 15.0000 mL | Freq: Once | OROMUCOSAL | Status: AC

## 2021-09-26 MED ORDER — MEPERIDINE HCL 25 MG/ML IJ SOLN: 6.2500 mg | INTRAMUSCULAR | Status: DC | PRN

## 2021-09-26 MED ORDER — ORAL CARE MOUTH RINSE: 15.0000 mL | Freq: Once | OROMUCOSAL | Status: AC

## 2021-09-26 MED ORDER — AMISULPRIDE (ANTIEMETIC) 5 MG/2ML IV SOLN: 5.0000 mg | Freq: Once | INTRAVENOUS | Status: DC | PRN

## 2021-09-26 MED ORDER — ONDANSETRON HCL 4 MG/2ML IJ SOLN
INTRAMUSCULAR | Status: AC
  Filled 2021-09-26: qty 2

## 2021-09-26 MED ORDER — ACETAMINOPHEN 500 MG PO TABS: 1000.0000 mg | ORAL_TABLET | Freq: Once | ORAL | Status: DC

## 2021-09-26 MED ORDER — LACTATED RINGERS IV SOLN: INTRAVENOUS | Status: DC

## 2021-09-26 MED ORDER — CHLORHEXIDINE GLUCONATE 0.12 % MT SOLN
OROMUCOSAL | Status: AC
  Administered 2021-09-26: 15 mL via OROMUCOSAL
  Filled 2021-09-26: qty 15

## 2021-09-26 MED ORDER — ACETAMINOPHEN 500 MG PO TABS
ORAL_TABLET | ORAL | Status: AC
  Administered 2021-09-26: 1000 mg via ORAL
  Filled 2021-09-26: qty 2

## 2021-09-26 MED ORDER — ONDANSETRON HCL 4 MG/2ML IJ SOLN
INTRAMUSCULAR | Status: DC | PRN
  Administered 2021-09-26: 4 mg via INTRAVENOUS

## 2021-09-26 MED ORDER — FENTANYL CITRATE (PF) 100 MCG/2ML IJ SOLN: 25.0000 ug | INTRAMUSCULAR | Status: DC | PRN

## 2021-09-26 MED ORDER — LIDOCAINE HCL (CARDIAC) PF 100 MG/5ML IV SOSY
PREFILLED_SYRINGE | INTRAVENOUS | Status: DC | PRN
  Administered 2021-09-26: 75 mg via INTRATRACHEAL

## 2021-09-26 MED ORDER — ROCURONIUM BROMIDE 100 MG/10ML IV SOLN
INTRAVENOUS | Status: DC | PRN
  Administered 2021-09-26: 60 mg via INTRAVENOUS

## 2021-09-26 MED ORDER — PROPOFOL 10 MG/ML IV BOLUS
INTRAVENOUS | Status: AC
  Filled 2021-09-26: qty 40

## 2021-09-26 MED ORDER — CEFAZOLIN SODIUM-DEXTROSE 2-4 GM/100ML-% IV SOLN
2.0000 g | INTRAVENOUS | Status: AC
  Administered 2021-09-26: 2 g via INTRAVENOUS

## 2021-09-26 MED ORDER — FENTANYL CITRATE (PF) 250 MCG/5ML IJ SOLN
INTRAMUSCULAR | Status: AC
  Filled 2021-09-26: qty 5

## 2021-09-26 MED ORDER — ENSURE PRE-SURGERY PO LIQD: 296.0000 mL | Freq: Once | ORAL | Status: DC

## 2021-09-26 MED ORDER — TRAMADOL HCL 50 MG PO TABS: 50.0000 mg | ORAL_TABLET | Freq: Four times a day (QID) | ORAL | 0 refills | Status: DC | PRN

## 2021-09-26 MED ORDER — DEXAMETHASONE SODIUM PHOSPHATE 10 MG/ML IJ SOLN
INTRAMUSCULAR | Status: DC | PRN
  Administered 2021-09-26: 4 mg via INTRAVENOUS

## 2021-09-26 MED ORDER — DEXAMETHASONE SODIUM PHOSPHATE 10 MG/ML IJ SOLN
INTRAMUSCULAR | Status: AC
  Filled 2021-09-26: qty 1

## 2021-09-26 SURGICAL SUPPLY — 46 items
BAG COUNTER SPONGE SURGICOUNT (BAG) ×1 IMPLANT
CANISTER SUCT 3000ML PPV (MISCELLANEOUS) IMPLANT
COVER SURGICAL LIGHT HANDLE (MISCELLANEOUS) ×1 IMPLANT
DERMABOND ADVANCED (GAUZE/BANDAGES/DRESSINGS) ×2
DERMABOND ADVANCED .7 DNX12 (GAUZE/BANDAGES/DRESSINGS) ×1 IMPLANT
DISSECTOR BLUNT TIP ENDO 5MM (MISCELLANEOUS) IMPLANT
ELECT REM PT RETURN 9FT ADLT (ELECTROSURGICAL) ×1
ELECTRODE REM PT RTRN 9FT ADLT (ELECTROSURGICAL) ×1 IMPLANT
GLOVE BIO SURGEON STRL SZ 6 (GLOVE) IMPLANT
GLOVE BIO SURGEON STRL SZ7.5 (GLOVE) ×1 IMPLANT
GLOVE SURG SYN 7.5  E (GLOVE) ×1
GLOVE SURG SYN 7.5 E (GLOVE) ×1 IMPLANT
GLOVE SURG SYN 7.5 PF PI (GLOVE) ×1 IMPLANT
GOWN STRL REUS W/ TWL LRG LVL3 (GOWN DISPOSABLE) ×2 IMPLANT
GOWN STRL REUS W/ TWL XL LVL3 (GOWN DISPOSABLE) ×1 IMPLANT
GOWN STRL REUS W/TWL LRG LVL3 (GOWN DISPOSABLE) ×3
GOWN STRL REUS W/TWL XL LVL3 (GOWN DISPOSABLE) ×1
KIT BASIN OR (CUSTOM PROCEDURE TRAY) ×1 IMPLANT
KIT TURNOVER KIT B (KITS) ×1 IMPLANT
MESH 3DMAX MID 5X7 RT XLRG (Mesh General) IMPLANT
NDL INSUFFLATION 14GA 120MM (NEEDLE) IMPLANT
NEEDLE INSUFFLATION 14GA 120MM (NEEDLE) ×1 IMPLANT
NS IRRIG 1000ML POUR BTL (IV SOLUTION) ×1 IMPLANT
PAD ARMBOARD 7.5X6 YLW CONV (MISCELLANEOUS) ×2 IMPLANT
RELOAD STAPLE 4.0 BLU F/HERNIA (INSTRUMENTS) IMPLANT
RELOAD STAPLE 4.8 BLK F/HERNIA (STAPLE) IMPLANT
RELOAD STAPLE HERNIA 4.0 BLUE (INSTRUMENTS) IMPLANT
RELOAD STAPLE HERNIA 4.8 BLK (STAPLE) IMPLANT
SCISSORS LAP 5X35 DISP (ENDOMECHANICALS) ×1 IMPLANT
SET IRRIG TUBING LAPAROSCOPIC (IRRIGATION / IRRIGATOR) IMPLANT
SET TUBE SMOKE EVAC HIGH FLOW (TUBING) ×1 IMPLANT
STAPLER HERNIA 12 8.5 360D (INSTRUMENTS) IMPLANT
SUT MNCRL AB 4-0 PS2 18 (SUTURE) ×1 IMPLANT
SUT VIC AB 1 CT1 27 (SUTURE)
SUT VIC AB 1 CT1 27XBRD ANBCTR (SUTURE) IMPLANT
SYRINGE TOOMEY DISP (SYRINGE) ×1 IMPLANT
TOWEL GREEN STERILE (TOWEL DISPOSABLE) ×1 IMPLANT
TOWEL GREEN STERILE FF (TOWEL DISPOSABLE) ×1 IMPLANT
TRAY FOLEY W/BAG SLVR 14FR (SET/KITS/TRAYS/PACK) ×1 IMPLANT
TRAY LAPAROSCOPIC MC (CUSTOM PROCEDURE TRAY) ×1 IMPLANT
TROCAR OPTICAL SHORT 5MM (TROCAR) ×1 IMPLANT
TROCAR OPTICAL SLV SHORT 5MM (TROCAR) ×1 IMPLANT
TROCAR XCEL 12X100 BLDLESS (ENDOMECHANICALS) ×1 IMPLANT
TROCAR Z THREAD OPTICAL 12X100 (TROCAR) IMPLANT
WARMER LAPAROSCOPE (MISCELLANEOUS) ×1 IMPLANT
WATER STERILE IRR 1000ML POUR (IV SOLUTION) ×1 IMPLANT

## 2021-09-26 NOTE — Transfer of Care (Signed)
Immediate Anesthesia Transfer of Care Note  Patient: Jaime Gates  Procedure(s) Performed: LAPAROSCOPIC RIGHT INGUINAL HERNIA REPAIR WITH MESH (Right)  Patient Location: PACU  Anesthesia Type:General  Level of Consciousness: drowsy  Airway & Oxygen Therapy: Patient Spontanous Breathing and Patient connected to nasal cannula oxygen  Post-op Assessment: Report given to RN and Post -op Vital signs reviewed and stable  Post vital signs: Reviewed and stable  Last Vitals:  Vitals Value Taken Time  BP    Temp    Pulse    Resp    SpO2      Last Pain:  Vitals:   09/26/21 0625  TempSrc:   PainSc: 0-No pain         Complications: No notable events documented.

## 2021-09-26 NOTE — Anesthesia Postprocedure Evaluation (Signed)
Anesthesia Post Note  Patient: Jaime Gates  Procedure(s) Performed: LAPAROSCOPIC RIGHT INGUINAL HERNIA REPAIR WITH MESH (Right)     Patient location during evaluation: PACU Anesthesia Type: General Level of consciousness: sedated and patient cooperative Pain management: pain level controlled Vital Signs Assessment: post-procedure vital signs reviewed and stable Respiratory status: spontaneous breathing Cardiovascular status: stable Anesthetic complications: no   No notable events documented.  Last Vitals:  Vitals:   09/26/21 0915 09/26/21 0930  BP: (!) 103/55 110/67  Pulse: (!) 51 (!) 45  Resp: 16 11  Temp:  36.6 C  SpO2: 92% 94%    Last Pain:  Vitals:   09/26/21 0930  TempSrc:   PainSc: [place]

## 2021-09-26 NOTE — Progress Notes (Signed)
Bladder scanned at 0845 145Mls, was able to void small amount and did a post void bladder scan that showed 27ms. Dr. RRosendo Groswith good with patient being discharged.

## 2021-09-26 NOTE — Interval H&P Note (Signed)
History and Physical Interval Note:  09/26/2021 7:13 AM  Jaime Gates  has presented today for surgery, with the diagnosis of RIGHT INGUINAL HERNIA.  The various methods of treatment have been discussed with the patient and family. After consideration of risks, benefits and other options for treatment, the patient has consented to  Procedure(s): LAPAROSCOPIC RIGHT INGUINAL HERNIA REPAIR WITH MESH (Right) as a surgical intervention.  The patient's history has been reviewed, patient examined, no change in status, stable for surgery.  I have reviewed the patient's chart and labs.  Questions were answered to the patient's satisfaction.     Ralene Ok

## 2021-09-26 NOTE — Anesthesia Procedure Notes (Signed)
Procedure Name: Intubation Date/Time: 09/26/2021 7:41 AM  Performed by: Leonor Liv, CRNAPre-anesthesia Checklist: Patient identified, Emergency Drugs available, Suction available and Patient being monitored Patient Re-evaluated:Patient Re-evaluated prior to induction Oxygen Delivery Method: Circle System Utilized Preoxygenation: Pre-oxygenation with 100% oxygen Induction Type: IV induction Ventilation: Mask ventilation without difficulty Laryngoscope Size: Mac and 4 Grade View: Grade I Tube type: Oral Tube size: 7.5 mm Number of attempts: 1 Airway Equipment and Method: Stylet and Oral airway Placement Confirmation: ETT inserted through vocal cords under direct vision, positive ETCO2 and breath sounds checked- equal and bilateral Secured at: 23 cm Tube secured with: Tape Dental Injury: Teeth and Oropharynx as per pre-operative assessment

## 2021-09-26 NOTE — Op Note (Signed)
09/26/2021  8:14 AM  PATIENT:  Jaime Gates  88 y.o. male  PRE-OPERATIVE DIAGNOSIS:  RIGHT INGUINAL HERNIA  POST-OPERATIVE DIAGNOSIS:  RIGHT INDIRECT INGUINAL HERNIA  PROCEDURE:  Procedure(s): LAPAROSCOPIC RIGHT INGUINAL HERNIA REPAIR WITH MESH (Right)  SURGEON:  Surgeon(s) and Role:    * Ralene Ok, MD - Primary   ASSISTANTS: Ewell Poe, RNFA   ANESTHESIA:   local and general  EBL:  minimal   BLOOD ADMINISTERED:none  DRAINS: none   LOCAL MEDICATIONS USED:  BUPIVICAINE   SPECIMEN:  No Specimen  DISPOSITION OF SPECIMEN:  N/A  COUNTS:  YES  TOURNIQUET:  * No tourniquets in log *  DICTATION: .Dragon Dictation Counts: reported as correct x 2  Findings:  The patient had a right large indirect hernia  Indications for procedure:  The patient is a 88 year old male with a right inguinal hernia for several months. Patient complained of symptomatology to his right inguinal area. The patient was taken back for elective inguinal hernia repair.  Details of the procedure: The patient was taken back to the operating room. The patient was placed in supine position with bilateral SCDs in place.  The patient was prepped and draped in the usual sterile fashion.  After appropriate anitbiotics were confirmed, a time-out was confirmed and all facts were verified.  0.25% Marcaine was used to infiltrate the umbilical area. A 11-blade was used to cut down the skin and blunt dissection was used to get the anterior fashion.  The anterior fascia was incised approximately 1 cm and the muscles were retracted laterally. Blunt dissection was then used to create a space in the preperitoneal area. At this time a 10 mm camera was then introduced into the space and advanced the pubic tubercle and a 12 mm trocar was placed over this and insufflation was started.  At this time and space was created from medial to laterally the preperitoneal space.  Cooper's ligament was initially cleaned off.   The hernia sac was identified in the indirect space. Dissection of the hernia sac and cord structures was undertaken the vas deferens was identified and protected in all parts of the case.    Once the hernia sac was taken down to approximately the umbilicus a Bard 3D MID mesh, size: Rachelle Hora, was  introduced into the preperitoneal space.  The mesh was brought over to cover the direct and indirect hernia spaces.  This was anchored into place and secured to Cooper's ligament with 4.37m staples from a Coviden hernia stapler. It was anchored to the anterior abdominal wall with 4.8 mm staples. The hernia sac was seen lying posterior to the mesh. There was no staples placed laterally. The insufflation was evacuated and the peritoneum was seen posterior to the mesh. The trochars were removed. The anterior fascia was reapproximated using #1 Vicryl on a UR- 6.  Intra-abdominal air was evacuated and the Veress needle removed. The skin was reapproximated using 4-0 Monocryl subcuticular fashion and Dermabond. The patient was awakened from general anesthesia and taken to recovery in stable condition.   PLAN OF CARE: Discharge to home after PACU  PATIENT DISPOSITION:  PACU - hemodynamically stable.   Delay start of Pharmacological VTE agent (>24hrs) due to surgical blood loss or risk of bleeding: not applicable

## 2021-09-26 NOTE — Discharge Instructions (Signed)
[facility] _______[facility] ? ?INGUINAL HERNIA REPAIR: POST OP INSTRUCTIONS ? ?Always review your discharge instruction sheet given to you by the facility where your surgery was performed. ?IF YOU HAVE DISABILITY OR FAMILY LEAVE FORMS, YOU MUST BRING THEM TO THE OFFICE FOR PROCESSING.   ?DO NOT GIVE THEM TO YOUR DOCTOR. ? ?1. A  prescription for pain medication may be given to you upon discharge.  Take your pain medication as prescribed, if needed.  If narcotic pain medicine is not needed, then you may take acetaminophen (Tylenol) or ibuprofen (Advil) as needed. ?2. Take your usually prescribed medications unless otherwise directed. ?If you need a refill on your pain medication, please contact your pharmacy.  They will contact our office to request authorization. Prescriptions will not be filled after 5 pm or on week-ends. ?3. You should follow a light diet the first 24 hours after arrival home, such as soup and crackers, etc.  Be sure to include lots of fluids daily.  Resume your normal diet the day after surgery. ?4.Most patients will experience some swelling and bruising around the umbilicus or in the groin and scrotum.  Ice packs and reclining will help.  Swelling and bruising can take several days to resolve.  ?6. It is common to experience some constipation if taking pain medication after surgery.  Increasing fluid intake and taking a stool softener (such as Colace) will usually help or prevent this problem from occurring.  A mild laxative (Milk of Magnesia or Miralax) should be taken according to package directions if there are no bowel movements after 48 hours. ?7. Unless discharge instructions indicate otherwise, you may remove your bandages 24-48 hours after surgery, and you may shower at that time.  You may have steri-strips (small skin tapes) in place directly over the incision.  These strips should be left on the skin for 7-10 days.  If your surgeon used skin glue on the incision, you may  shower in 24 hours.  The glue will flake off over the next 2-3 weeks.  Any sutures or staples will be removed at the office during your follow-up visit. ?8. ACTIVITIES:  You may resume regular (light) daily activities beginning the next day--such as daily self-care, walking, climbing stairs--gradually increasing activities as tolerated.  You may have sexual intercourse when it is comfortable.  Refrain from any heavy lifting or straining until approved by your doctor. ? ?a.You may drive when you are no longer taking prescription pain medication, you can comfortably wear a seatbelt, and you can safely maneuver your car and apply brakes. ?b.RETURN TO WORK:   ?_____________________________________________ ? ?9.You should see your doctor in the office for a follow-up appointment approximately 2-3 weeks after your surgery.  Make sure that you call for this appointment within a day or two after you arrive home to insure a convenient appointment time. ?10.OTHER INSTRUCTIONS: _________________________ ?   _____________________________________ ? ?WHEN TO CALL YOUR DOCTOR: ?Fever over 101.0 ?Inability to urinate ?Nausea and/or vomiting ?Extreme swelling or bruising ?Continued bleeding from incision. ?Increased pain, redness, or drainage from the incision ? ?The clinic staff is available to answer your questions during regular business hours.  Please don?t hesitate to call and ask to speak to one of the nurses for clinical concerns.  If you have a medical emergency, go to the nearest emergency room or call 911.  A surgeon from [facility] is always on call at the hospital ? ? ?[address], [address], [place], [place]    [address] ? ? [address] [address], [place], [place]   [address] ?[phone] ? [phone] ? FAX [phone] ?Web site: [email or web address] ? ?

## 2021-09-27 ENCOUNTER — Encounter (HOSPITAL_COMMUNITY): Payer: Self-pay | Admitting: General Surgery

## 2021-10-11 DIAGNOSIS — R330 Drug induced retention of urine: Secondary | ICD-10-CM | POA: Diagnosis not present

## 2021-10-21 DIAGNOSIS — H04123 Dry eye syndrome of bilateral lacrimal glands: Secondary | ICD-10-CM | POA: Diagnosis not present

## 2021-10-21 DIAGNOSIS — H353131 Nonexudative age-related macular degeneration, bilateral, early dry stage: Secondary | ICD-10-CM | POA: Diagnosis not present

## 2021-10-21 DIAGNOSIS — H35371 Puckering of macula, right eye: Secondary | ICD-10-CM | POA: Diagnosis not present

## 2021-10-29 DIAGNOSIS — R001 Bradycardia, unspecified: Secondary | ICD-10-CM | POA: Diagnosis not present

## 2021-10-29 DIAGNOSIS — I493 Ventricular premature depolarization: Secondary | ICD-10-CM | POA: Diagnosis not present

## 2021-10-29 DIAGNOSIS — R9431 Abnormal electrocardiogram [ECG] [EKG]: Secondary | ICD-10-CM | POA: Diagnosis not present

## 2021-11-06 DIAGNOSIS — G4710 Hypersomnia, unspecified: Secondary | ICD-10-CM | POA: Diagnosis not present

## 2021-11-07 DIAGNOSIS — G4710 Hypersomnia, unspecified: Secondary | ICD-10-CM | POA: Diagnosis not present

## 2021-11-21 DIAGNOSIS — G4733 Obstructive sleep apnea (adult) (pediatric): Secondary | ICD-10-CM | POA: Diagnosis not present

## 2022-01-15 ENCOUNTER — Ambulatory Visit: Payer: Medicare Other | Attending: Cardiology

## 2022-01-15 ENCOUNTER — Encounter: Payer: Self-pay | Admitting: Cardiology

## 2022-01-15 ENCOUNTER — Ambulatory Visit: Payer: Medicare Other | Attending: Cardiology | Admitting: Cardiology

## 2022-01-15 VITALS — BP 120/70 | HR 65 | Ht 70.0 in | Wt 166.0 lb

## 2022-01-15 DIAGNOSIS — E785 Hyperlipidemia, unspecified: Secondary | ICD-10-CM | POA: Diagnosis not present

## 2022-01-15 DIAGNOSIS — I493 Ventricular premature depolarization: Secondary | ICD-10-CM

## 2022-01-15 DIAGNOSIS — I519 Heart disease, unspecified: Secondary | ICD-10-CM

## 2022-01-15 DIAGNOSIS — I2510 Atherosclerotic heart disease of native coronary artery without angina pectoris: Secondary | ICD-10-CM

## 2022-01-15 DIAGNOSIS — R0602 Shortness of breath: Secondary | ICD-10-CM

## 2022-01-15 MED ORDER — ATORVASTATIN CALCIUM 10 MG PO TABS: 10.0000 mg | ORAL_TABLET | Freq: Every day | ORAL | 3 refills | Status: DC

## 2022-01-15 NOTE — Patient Instructions (Addendum)
Medication Instructions:  START atorvastatin (Lipitor) 10 mg daily  Testing/Procedures: CARDIAC PET- Your physician has requested that you have a Cardiac Pet Stress Test. [facility] [place] [address] The schedulers will call you to get this scheduled. Please follow instructions below and call the office with any questions/concerns [phone]).  Your physician has requested that you have an echocardiogram. Echocardiography is a painless test that uses sound waves to create images of your heart. It provides your doctor with information about the size and shape of your heart and how well your heart's chambers and valves are working. This procedure takes approximately one hour. There are no restrictions for this procedure. Please do NOT wear cologne, perfume, aftershave, or lotions (deodorant is allowed). Please arrive 15 minutes prior to your appointment time.  ZIO XT- Long Term Monitor Instructions   Your physician has requested you wear a ZIO patch monitor for _7_ days.  This is a single patch monitor.   [employer] supplies one patch monitor per enrollment. Additional stickers are not available. Please do not apply patch if you will be having a Nuclear Stress Test, Echocardiogram, Cardiac CT, MRI, or Chest Xray during the period you would be wearing the monitor. The patch cannot be worn during these tests. You cannot remove and re-apply the ZIO XT patch monitor.  Your ZIO patch monitor will be sent Fed Ex from [employer] directly to your home address. It may take 3-5 days to receive your monitor after you have been enrolled.  Once you have received your monitor, please review the enclosed instructions. Your monitor has already been registered assigning a specific monitor serial # to you.  Billing and Patient Assistance Program Information   We have supplied [employer] with any of your insurance information on file  for billing purposes. [employer] offers a sliding scale Patient Assistance Program for patients that do not have insurance, or whose insurance does not completely cover the cost of the ZIO monitor.   You must apply for the Patient Assistance Program to qualify for this discounted rate.     To apply, please call [employer] at [phone], select option 4, then select option 2, and ask to apply for Patient Assistance Program.  Theodore Demark will ask your household income, and how many people are in your household.  They will quote your out-of-pocket cost based on that information.  [employer] will also be able to set up a 41-month interest-free payment plan if needed.  Applying the monitor   Shave hair from upper left chest.  Hold abrader disc by orange tab. Rub abrader in 40 strokes over the upper left chest as indicated in your monitor instructions.  Clean area with 4 enclosed alcohol pads. Let dry.  Apply patch as indicated in monitor instructions. Patch will be placed under collarbone on left side of chest with arrow pointing upward.  Rub patch adhesive wings for 2 minutes. Remove white label marked "1". Remove the white label marked "2". Rub patch adhesive wings for 2 additional minutes.  While looking in a mirror, press and release button in center of patch. A small green light will flash 3-4 times. This will be your only indicator that the monitor has been turned on. ?  Do not shower for the first 24 hours. You may shower after the first 24 hours.  Press the button if you feel a symptom. You will hear a small click. Record Date, Time and Symptom in  the Patient Logbook.  When you are ready to remove the patch, follow instructions on the last 2 pages of the Patient Logbook. Stick patch monitor onto the last page of Patient Logbook.  Place Patient Logbook in the blue and white box.  Use locking tab on box and tape box closed securely.  The blue and white box has prepaid postage on it. Please place it in the  mailbox as soon as possible. Your physician should have your test results approximately 7 days after the monitor has been mailed back to [facility].  Call [facility] at [phone] if you have questions regarding your ZIO XT patch monitor. Call them immediately if you see an orange light blinking on your monitor.  If your monitor falls off in less than 4 days, contact our Monitor department at [phone]. ?If your monitor becomes loose or falls off after 4 days call [employer] at [phone] for suggestions on securing your monitor.?  Follow-Up: At [facility], you and your health needs are our priority.  As part of our continuing mission to provide you with exceptional heart care, we have created designated Provider Care Teams.  These Care Teams include your primary Cardiologist (physician) and Advanced Practice Providers (APPs -  Physician Assistants and Nurse Practitioners) who all work together to provide you with the care you need, when you need it.  We recommend signing up for the patient portal called "MyChart".  Sign up information is provided on this After Visit Summary.  MyChart is used to connect with patients for Virtual Visits (Telemedicine).  Patients are able to view lab/test results, encounter notes, upcoming appointments, etc.  Non-urgent messages can be sent to your provider as well.   To learn more about what you can do with MyChart, go to [email or web address].    Your next appointment:   3 month(s)  The format for your next appointment:   In Person  Provider:   Dr. Gardiner Rhyme Other Instructions How to Prepare for Your Cardiac PET/CT Stress Test:  1. Please do not take these medications before your test:   Medications that may interfere with the cardiac pharmacological stress agent (ex. nitrates - including erectile dysfunction medications, isosorbide mononitrate or beta-blockers) the day of the exam. (Erectile dysfunction medication  should be held for at least 72 hrs prior to test) Theophylline containing medications for 12 hours. Dipyridamole 48 hours prior to the test. Your remaining medications may be taken with water.  2. Nothing to eat or drink, except water, 3 hours prior to arrival time.   NO caffeine/decaffeinated products, or chocolate 12 hours prior to arrival.  3. NO perfume, cologne or lotion  4. Total time is 1 to 2 hours; you may want to bring reading material for the waiting time.  5. Please report to Admitting at the [facility] Main Entrance 30 minutes early for your test.  [place], [place] [address]  Diabetic Preparation:  Hold oral medications. You may take NPH and Lantus insulin. Do not take Humalog or Humulin R (Regular Insulin) the day of your test. Check blood sugars prior to leaving the house. If able to eat breakfast prior to 3 hour fasting, you may take all medications, including your insulin, Do not worry if you miss your breakfast dose of insulin - start at your next meal.  IF YOU THINK YOU MAY BE PREGNANT, OR ARE NURSING PLEASE INFORM THE TECHNOLOGIST.  In preparation for your appointment, medication and supplies will  be purchased.  Appointment availability is limited, so if you need to cancel or reschedule, please call the Radiology Department at [phone]  24 hours in advance to avoid a cancellation fee of $100.00  What to Expect After you Arrive:  Once you arrive and check in for your appointment, you will be taken to a preparation room within the Radiology Department.  A technologist or Nurse will obtain your medical history, verify that you are correctly prepped for the exam, and explain the procedure.  Afterwards,  an IV will be started in your arm and electrodes will be placed on your skin for EKG monitoring during the stress portion of the exam. Then you will be escorted to the PET/CT scanner.  There, staff will get you positioned on the scanner and  obtain a blood pressure and EKG.  During the exam, you will continue to be connected to the EKG and blood pressure machines.  A small, safe amount of a radioactive tracer will be injected in your IV to obtain a series of pictures of your heart along with an injection of a stress agent.    After your Exam:  It is recommended that you eat a meal and drink a caffeinated beverage to counter act any effects of the stress agent.  Drink plenty of fluids for the remainder of the day and urinate frequently for the first couple of hours after the exam.  Your doctor will inform you of your test results within 7-10 business days.  For questions about your test or how to prepare for your test, please call: Marchia Bond, Cardiac Imaging Nurse Navigator  Gordy Clement, Cardiac Imaging Nurse Navigator Office: [phone]

## 2022-01-15 NOTE — Progress Notes (Unsigned)
Enrolled for [employer] to mail a ZIO XT long term holter monitor to the patients address on file.  
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
## 2022-01-19 DIAGNOSIS — I493 Ventricular premature depolarization: Secondary | ICD-10-CM

## 2022-02-11 DIAGNOSIS — I493 Ventricular premature depolarization: Secondary | ICD-10-CM | POA: Diagnosis not present

## 2022-02-14 ENCOUNTER — Telehealth (HOSPITAL_COMMUNITY): Payer: Self-pay | Admitting: Emergency Medicine

## 2022-02-14 NOTE — Telephone Encounter (Signed)
Reaching out to patient to offer assistance regarding upcoming cardiac imaging study; pt verbalizes understanding of appt date/time, parking situation and where to check in, pre-test NPO status and medications ordered, and verified current allergies; name and call back number provided for further questions should they arise Marchia Bond RN Navigator Cardiac Imaging [facility] [phone] office [phone] cell  Arrival 920 Daily meds except viagra No food  No caffiene

## 2022-02-17 ENCOUNTER — Ambulatory Visit (HOSPITAL_COMMUNITY): Payer: Medicare Other | Attending: Cardiovascular Disease

## 2022-02-17 DIAGNOSIS — R0609 Other forms of dyspnea: Secondary | ICD-10-CM | POA: Diagnosis not present

## 2022-02-17 DIAGNOSIS — R0602 Shortness of breath: Secondary | ICD-10-CM | POA: Diagnosis not present

## 2022-02-17 LAB — ECHOCARDIOGRAM COMPLETE
Area-P 1/2: 1.45 cm2
MV M vel: 5.15 m/s
MV Peak grad: 106.1 mmHg
P 1/2 time: 819 msec
S' Lateral: 3.1 cm

## 2022-02-18 ENCOUNTER — Encounter (HOSPITAL_COMMUNITY)
Admission: RE | Admit: 2022-02-18 | Discharge: 2022-02-18 | Disposition: A | Discharge status: Home / Self Care | Payer: Medicare Other | Source: Ambulatory Visit | Attending: Cardiology | Admitting: Cardiology

## 2022-02-18 DIAGNOSIS — R0602 Shortness of breath: Secondary | ICD-10-CM | POA: Diagnosis not present

## 2022-02-18 LAB — NM PET CT CARDIAC PERFUSION MULTI W/ABSOLUTE BLOODFLOW
MBFR: 2.85
Rest MBF: 0.67 ml/g/min
Rest Nuclear Isotope Dose: 19.5 mCi
ST Depression (mm): 0 mm
Stress MBF: 1.91 ml/g/min
Stress Nuclear Isotope Dose: 19.5 mCi

## 2022-02-18 MED ORDER — REGADENOSON 0.4 MG/5ML IV SOLN
INTRAVENOUS | Status: AC
  Administered 2022-02-18: 0.4 mg via INTRAVENOUS
  Filled 2022-02-18: qty 5

## 2022-02-18 MED ORDER — RUBIDIUM RB82 GENERATOR (RUBYFILL)
19.5000 | PACK | Freq: Once | INTRAVENOUS | Status: AC
  Administered 2022-02-18: 19.5 via INTRAVENOUS

## 2022-02-18 MED ORDER — REGADENOSON 0.4 MG/5ML IV SOLN
0.4000 mg | Freq: Once | INTRAVENOUS | Status: AC
Start: 2022-02-18 — End: 2022-02-18

## 2022-04-01 DIAGNOSIS — Z125 Encounter for screening for malignant neoplasm of prostate: Secondary | ICD-10-CM | POA: Diagnosis not present

## 2022-04-01 DIAGNOSIS — R002 Palpitations: Secondary | ICD-10-CM | POA: Diagnosis not present

## 2022-04-01 DIAGNOSIS — E785 Hyperlipidemia, unspecified: Secondary | ICD-10-CM | POA: Diagnosis not present

## 2022-04-01 DIAGNOSIS — K219 Gastro-esophageal reflux disease without esophagitis: Secondary | ICD-10-CM | POA: Diagnosis not present

## 2022-04-01 DIAGNOSIS — R739 Hyperglycemia, unspecified: Secondary | ICD-10-CM | POA: Diagnosis not present

## 2022-04-08 DIAGNOSIS — H903 Sensorineural hearing loss, bilateral: Secondary | ICD-10-CM | POA: Diagnosis not present

## 2022-04-08 DIAGNOSIS — R82998 Other abnormal findings in urine: Secondary | ICD-10-CM | POA: Diagnosis not present

## 2022-04-08 DIAGNOSIS — Z1331 Encounter for screening for depression: Secondary | ICD-10-CM | POA: Diagnosis not present

## 2022-04-08 DIAGNOSIS — I2510 Atherosclerotic heart disease of native coronary artery without angina pectoris: Secondary | ICD-10-CM | POA: Diagnosis not present

## 2022-04-08 DIAGNOSIS — E785 Hyperlipidemia, unspecified: Secondary | ICD-10-CM | POA: Diagnosis not present

## 2022-04-08 DIAGNOSIS — M7918 Myalgia, other site: Secondary | ICD-10-CM | POA: Diagnosis not present

## 2022-04-08 DIAGNOSIS — Z0000 Encounter for general adult medical examination without abnormal findings: Secondary | ICD-10-CM | POA: Diagnosis not present

## 2022-04-08 DIAGNOSIS — Z1339 Encounter for screening examination for other mental health and behavioral disorders: Secondary | ICD-10-CM | POA: Diagnosis not present

## 2022-04-08 DIAGNOSIS — M6281 Muscle weakness (generalized): Secondary | ICD-10-CM | POA: Diagnosis not present

## 2022-04-10 DIAGNOSIS — M791 Myalgia, unspecified site: Secondary | ICD-10-CM | POA: Diagnosis not present

## 2022-04-11 ENCOUNTER — Other Ambulatory Visit: Payer: Self-pay

## 2022-04-11 MED ORDER — ATORVASTATIN CALCIUM 10 MG PO TABS: 10.0000 mg | ORAL_TABLET | Freq: Every day | ORAL | 2 refills | Status: DC
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
## 2022-04-22 ENCOUNTER — Encounter: Payer: Self-pay | Admitting: Cardiology

## 2022-04-22 ENCOUNTER — Ambulatory Visit: Payer: Medicare Other | Attending: Cardiology | Admitting: Cardiology

## 2022-04-22 VITALS — BP 115/68 | HR 59 | Ht 70.0 in | Wt 167.6 lb

## 2022-04-22 DIAGNOSIS — I493 Ventricular premature depolarization: Secondary | ICD-10-CM | POA: Diagnosis not present

## 2022-04-22 DIAGNOSIS — E785 Hyperlipidemia, unspecified: Secondary | ICD-10-CM | POA: Diagnosis not present

## 2022-04-22 DIAGNOSIS — I2510 Atherosclerotic heart disease of native coronary artery without angina pectoris: Secondary | ICD-10-CM | POA: Insufficient documentation

## 2022-04-22 DIAGNOSIS — I340 Nonrheumatic mitral (valve) insufficiency: Secondary | ICD-10-CM | POA: Diagnosis not present

## 2022-04-22 NOTE — Patient Instructions (Signed)
Medication Instructions:  Your physician recommends that you continue on your current medications as directed. Please refer to the Current Medication list given to you today.  *If you need a refill on your cardiac medications before your next appointment, please call your pharmacy*   Lab Work: BMET, CBC today  If you have labs (blood work) drawn today and your tests are completely normal, you will receive your results only by: [place] (if you have MyChart) OR A paper copy in the mail If you have any lab test that is abnormal or we need to change your treatment, we will call you to review the results.   Testing/Procedures: Your physician has requested that you have a cardiac MRI. Cardiac MRI uses a computer to create images of your heart as its beating, producing both still and moving pictures of your heart and major blood vessels. For further information please visit [email or web address] Please follow the instruction sheet given to you today for more information.  Follow-Up: At [facility], you and your health needs are our priority.  As part of our continuing mission to provide you with exceptional heart care, we have created designated Provider Care Teams.  These Care Teams include your primary Cardiologist (physician) and Advanced Practice Providers (APPs -  Physician Assistants and Nurse Practitioners) who all work together to provide you with the care you need, when you need it.  We recommend signing up for the patient portal called "MyChart".  Sign up information is provided on this After Visit Summary.  MyChart is used to connect with patients for Virtual Visits (Telemedicine).  Patients are able to view lab/test results, encounter notes, upcoming appointments, etc.  Non-urgent messages can be sent to your provider as well.   To learn more about what you can do with MyChart, go to [email or web address].    Your next appointment:   6 month(s)  Provider:   Dr.  Gardiner Rhyme Other Instructions   You are scheduled for Cardiac MRI on ______________. Please arrive for your appointment at ______________ ( arrive 30-45 minutes prior to test start time). ?  [facility] [address] [place], [place] [address] [phone] Please take advantage of the free valet parking available at the MAIN entrance (A entrance).  Proceed to the [facility] Radiology Department (First Floor) for check-in.   [facility] [place], [place] [address] [phone] Please take advantage of the free valet parking available at the MAIN entrance. Proceed to [facility] registration for check-in (first floor).  Magnetic resonance imaging (MRI) is a painless test that produces images of the inside of the body without using Xrays.  During an MRI, strong magnets and radio waves work together in a Research officer, political party to form detailed images.   MRI images may provide more details about a medical condition than X-rays, CT scans, and ultrasounds can provide.  You may be given earphones to listen for instructions.  You may eat a light breakfast and take medications as ordered with the exception of furosemide, hydrochlorothiazide, or spironolactone(fluid pill, other). Please avoid stimulants for 12 hr prior to test. (Ie. Caffeine, nicotine, chocolate, or antihistamine medications)  If a contrast material will be used, an IV will be inserted into one of your veins. Contrast material will be injected into your IV. It will leave your body through your urine within a day. You may be told to drink plenty of fluids to help flush the contrast material out of  your system.  You will be asked to remove all metal, including: Watch, jewelry, and other metal objects including hearing aids, hair pieces and dentures. Also wearable glucose monitoring systems (ie. Freestyle Libre and Omnipods) (Braces and fillings normally are not a problem.)   TEST WILL  TAKE APPROXIMATELY 1 HOUR  PLEASE NOTIFY SCHEDULING AT LEAST 24 HOURS IN ADVANCE IF YOU ARE UNABLE TO KEEP YOUR APPOINTMENT. [phone]  Please call Marchia Bond, cardiac imaging nurse navigator with any questions/concerns. Marchia Bond RN Navigator Cardiac Imaging Gordy Clement RN Navigator Cardiac Imaging [facility] [phone] Office

## 2022-04-23 LAB — CBC
Hematocrit: 49.1 % (ref 37.5–51.0)
Hemoglobin: 16.6 g/dL (ref 13.0–17.7)
MCH: 33.1 pg — ABNORMAL HIGH (ref 26.6–33.0)
MCHC: 33.8 g/dL (ref 31.5–35.7)
MCV: 98 fL — ABNORMAL HIGH (ref 79–97)
Platelets: 153 10*3/uL (ref 150–450)
RBC: 5.01 x10E6/uL (ref 4.14–5.80)
RDW: 11.8 % (ref 11.6–15.4)
WBC: 4.7 10*3/uL (ref 3.4–10.8)

## 2022-04-23 LAB — BASIC METABOLIC PANEL
BUN/Creatinine Ratio: 24 (ref 10–24)
BUN: 29 mg/dL — ABNORMAL HIGH (ref 8–27)
CO2: 26 mmol/L (ref 20–29)
Calcium: 9.4 mg/dL (ref 8.6–10.2)
Chloride: 101 mmol/L (ref 96–106)
Creatinine, Ser: 1.2 mg/dL (ref 0.76–1.27)
Glucose: 121 mg/dL — ABNORMAL HIGH (ref 70–99)
Potassium: 4.5 mmol/L (ref 3.5–5.2)
Sodium: 143 mmol/L (ref 134–144)
eGFR: 58 mL/min/{1.73_m2} — ABNORMAL LOW (ref >59)

## 2022-05-13 DIAGNOSIS — G4733 Obstructive sleep apnea (adult) (pediatric): Secondary | ICD-10-CM | POA: Diagnosis not present

## 2022-05-14 DIAGNOSIS — L570 Actinic keratosis: Secondary | ICD-10-CM | POA: Diagnosis not present

## 2022-05-14 DIAGNOSIS — L820 Inflamed seborrheic keratosis: Secondary | ICD-10-CM | POA: Diagnosis not present

## 2022-06-13 ENCOUNTER — Telehealth (HOSPITAL_COMMUNITY): Payer: Self-pay | Admitting: *Deleted

## 2022-06-13 NOTE — Telephone Encounter (Signed)
Reaching out to patient to offer assistance regarding upcoming cardiac imaging study; pt verbalizes understanding of appt date/time, parking situation and where to check in, and verified current allergies; name and call back number provided for further questions should they arise ? ?Donell Sliwinski RN Navigator Cardiac Imaging ?[facility] ?[phone] office ?[phone] cell ? ?Patient denies metal or claustrophobia. ?

## 2022-06-16 ENCOUNTER — Other Ambulatory Visit: Payer: Self-pay | Admitting: Cardiology

## 2022-06-16 ENCOUNTER — Ambulatory Visit (HOSPITAL_COMMUNITY)
Admission: RE | Admit: 2022-06-16 | Discharge: 2022-06-16 | Disposition: A | Discharge status: Home / Self Care | Payer: Medicare Other | Source: Ambulatory Visit | Attending: Cardiology | Admitting: Cardiology

## 2022-06-16 DIAGNOSIS — I340 Nonrheumatic mitral (valve) insufficiency: Secondary | ICD-10-CM

## 2022-06-16 DIAGNOSIS — I493 Ventricular premature depolarization: Secondary | ICD-10-CM | POA: Diagnosis not present

## 2022-06-16 MED ORDER — GADOBUTROL 1 MMOL/ML IV SOLN
8.0000 mL | Freq: Once | INTRAVENOUS | Status: AC | PRN
  Administered 2022-06-16: 8 mL via INTRAVENOUS

## 2022-07-06 NOTE — Progress Notes (Signed)
7/30 should be fine

## 2022-07-10 DIAGNOSIS — G4733 Obstructive sleep apnea (adult) (pediatric): Secondary | ICD-10-CM | POA: Diagnosis not present

## 2022-07-22 ENCOUNTER — Other Ambulatory Visit: Payer: Self-pay | Admitting: *Deleted

## 2022-07-22 ENCOUNTER — Telehealth: Payer: Self-pay | Admitting: Cardiology

## 2022-07-22 MED ORDER — ATORVASTATIN CALCIUM 10 MG PO TABS: 10.0000 mg | ORAL_TABLET | Freq: Every day | ORAL | 3 refills | Status: DC

## 2022-07-22 NOTE — Telephone Encounter (Signed)
Pt c/o medication issue:  1. Name of Medication: atorvastatin (LIPITOR) 10 MG tablet   2. How are you currently taking this medication (dosage and times per day)?  Take 1 tablet (10 mg total) by mouth daily.       3. Are you having a reaction (difficulty breathing--STAT)? No  4. What is your medication issue? Pt stated he'd like a callback at [phone] to discuss the back and forth he's experiencing right now with getting his medication [facility] the [facility] and [facility]. He stated he's spent 2 hrs on the phone trying to get it situated. Please advise

## 2022-07-22 NOTE — Telephone Encounter (Signed)
Patient aware [facility]vastatin sent to [facility]

## 2022-08-18 DIAGNOSIS — G4733 Obstructive sleep apnea (adult) (pediatric): Secondary | ICD-10-CM | POA: Diagnosis not present
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
## 2022-08-26 ENCOUNTER — Ambulatory Visit: Payer: Medicare Other | Attending: Cardiology | Admitting: Cardiology

## 2022-08-26 VITALS — BP 110/60 | HR 56 | Ht 70.0 in | Wt 168.6 lb

## 2022-08-26 DIAGNOSIS — Z79899 Other long term (current) drug therapy: Secondary | ICD-10-CM | POA: Insufficient documentation

## 2022-08-26 DIAGNOSIS — I493 Ventricular premature depolarization: Secondary | ICD-10-CM | POA: Insufficient documentation

## 2022-08-26 DIAGNOSIS — E785 Hyperlipidemia, unspecified: Secondary | ICD-10-CM | POA: Insufficient documentation

## 2022-08-26 DIAGNOSIS — I340 Nonrheumatic mitral (valve) insufficiency: Secondary | ICD-10-CM | POA: Diagnosis not present

## 2022-08-26 DIAGNOSIS — I5022 Chronic systolic (congestive) heart failure: Secondary | ICD-10-CM | POA: Insufficient documentation

## 2022-08-26 MED ORDER — LOSARTAN POTASSIUM 25 MG PO TABS: 12.5000 mg | ORAL_TABLET | Freq: Every day | ORAL | 3 refills | Status: DC

## 2022-08-26 NOTE — Patient Instructions (Addendum)
Medication Instructions:  START Losartan 12.5 mg daily *If you need a refill on your cardiac medications before your next appointment, please call your pharmacy*   Lab Work: BMP- Please return for Blood Work in 1 week. No appointment needed, lab here at the office is open Monday-Friday from 8AM to 4PM and closed daily for lunch from 12:45-1:45.   If you have labs (blood work) drawn today and your tests are completely normal, you will receive your results only by: MyChart Message (if you have MyChart) OR A paper copy in the mail If you have any lab test that is abnormal or we need to change your treatment, we will call you to review the results.  Follow-Up: At [facility], you and your health needs are our priority.  As part of our continuing mission to provide you with exceptional heart care, we have created designated Provider Care Teams.  These Care Teams include your primary Cardiologist (physician) and Advanced Practice Providers (APPs -  Physician Assistants and Nurse Practitioners) who all work together to provide you with the care you need, when you need it.  We recommend signing up for the patient portal called "MyChart".  Sign up information is provided on this After Visit Summary.  MyChart is used to connect with patients for Virtual Visits (Telemedicine).  Patients are able to view lab/test results, encounter notes, upcoming appointments, etc.  Non-urgent messages can be sent to your provider as well.   To learn more about what you can do with MyChart, go to [email or web address].    Your next appointment:    Already Scheduled appointment  September 10th at 0840  Provider:   Dr Bjorn Pippin  Please wear compression stockings while awake

## 2022-09-04 DIAGNOSIS — Z79899 Other long term (current) drug therapy: Secondary | ICD-10-CM | POA: Diagnosis not present

## 2022-09-23 DIAGNOSIS — H353131 Nonexudative age-related macular degeneration, bilateral, early dry stage: Secondary | ICD-10-CM | POA: Diagnosis not present
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
## 2022-10-07 ENCOUNTER — Ambulatory Visit (INDEPENDENT_AMBULATORY_CARE_PROVIDER_SITE_OTHER): Payer: Medicare Other

## 2022-10-07 ENCOUNTER — Ambulatory Visit: Payer: Medicare Other | Attending: Cardiology | Admitting: Cardiology

## 2022-10-07 ENCOUNTER — Encounter: Payer: Self-pay | Admitting: Cardiology

## 2022-10-07 VITALS — BP 124/66 | HR 58 | Ht 70.0 in | Wt 168.6 lb

## 2022-10-07 DIAGNOSIS — I493 Ventricular premature depolarization: Secondary | ICD-10-CM | POA: Diagnosis present

## 2022-10-07 DIAGNOSIS — I340 Nonrheumatic mitral (valve) insufficiency: Secondary | ICD-10-CM | POA: Diagnosis present

## 2022-10-07 DIAGNOSIS — Z79899 Other long term (current) drug therapy: Secondary | ICD-10-CM

## 2022-10-07 DIAGNOSIS — E785 Hyperlipidemia, unspecified: Secondary | ICD-10-CM | POA: Diagnosis not present

## 2022-10-07 DIAGNOSIS — I5022 Chronic systolic (congestive) heart failure: Secondary | ICD-10-CM

## 2022-10-07 MED ORDER — LOSARTAN POTASSIUM 25 MG PO TABS: 25.0000 mg | ORAL_TABLET | Freq: Every day | ORAL | 3 refills | Status: DC

## 2022-10-07 NOTE — Progress Notes (Unsigned)
Enrolled for [employer] to mail a ZIO XT long term holter monitor to the patients address on file.  

## 2022-10-07 NOTE — Patient Instructions (Signed)
Medication Instructions:  Increase Losartan to 25 mg ( Take 1 Tablet Daily). *If you need a refill on your cardiac medications before your next appointment, please call your pharmacy*   Lab Work: BMET.  1 Week. If you have labs (blood work) drawn today and your tests are completely normal, you will receive your results only by: MyChart Message (if you have MyChart) OR A paper copy in the mail If you have any lab test that is abnormal or we need to change your treatment, we will call you to review the results.   Testing/Procedures: Christena Deem- Long Term Monitor Instructions  Your physician has requested you wear a ZIO patch monitor for 7 days.  This is a single patch monitor. [employer] supplies one patch monitor per enrollment. Additional stickers are not available. Please do not apply patch if you will be having a Nuclear Stress Test,  Echocardiogram, Cardiac CT, MRI, or Chest Xray during the period you would be wearing the  monitor. The patch cannot be worn during these tests. You cannot remove and re-apply the  ZIO XT patch monitor.  Your ZIO patch monitor will be mailed 3 day USPS to your address on file. It may take 3-5 days  to receive your monitor after you have been enrolled.  Once you have received your monitor, please review the enclosed instructions. Your monitor  has already been registered assigning a specific monitor serial # to you.  Billing and Patient Assistance Program Information  We have supplied [employer] with any of your insurance information on file for billing purposes. [employer] offers a sliding scale Patient Assistance Program for patients that do not have  insurance, or whose insurance does not completely cover the cost of the ZIO monitor.  You must apply for the Patient Assistance Program to qualify for this discounted rate.  To apply, please call [employer] at [phone], select option 4, select option 2, ask to apply for  Patient Assistance Program. Meredeth Ide will  ask your household income, and how many people  are in your household. They will quote your out-of-pocket cost based on that information.  [employer] will also be able to set up a 68-month, interest-free payment plan if needed.  Applying the monitor   Shave hair from upper left chest.  Hold abrader disc by orange tab. Rub abrader in 40 strokes over the upper left chest as  indicated in your monitor instructions.  Clean area with 4 enclosed alcohol pads. Let dry.  Apply patch as indicated in monitor instructions. Patch will be placed under collarbone on left  side of chest with arrow pointing upward.  Rub patch adhesive wings for 2 minutes. Remove white label marked "1". Remove the white  label marked "2". Rub patch adhesive wings for 2 additional minutes.  While looking in a mirror, press and release button in center of patch. A small green light will  flash 3-4 times. This will be your only indicator that the monitor has been turned on.  Do not shower for the first 24 hours. You may shower after the first 24 hours.  Press the button if you feel a symptom. You will hear a small click. Record Date, Time and  Symptom in the Patient Logbook.  When you are ready to remove the patch, follow instructions on the last 2 pages of Patient  Logbook. Stick patch monitor onto the last page of Patient Logbook.  Place Patient Logbook in the blue and white box. Use locking tab on box and  tape box closed  securely. The blue and white box has prepaid postage on it. Please place it in the mailbox as  soon as possible. Your physician should have your test results approximately 7 days after the  monitor has been mailed back to [facility].  Call [facility] Customer Care at [phone] if you have questions regarding  your ZIO XT patch monitor. Call them immediately if you see an orange light blinking on your  monitor.  If your monitor falls off in less than 4 days, contact our Monitor department at  [phone].  If your monitor becomes loose or falls off after 4 days call [employer] at [phone] for  suggestions on securing your monitor    Follow-Up: At [facility], you and your health needs are our priority.  As part of our continuing mission to provide you with exceptional heart care, we have created designated Provider Care Teams.  These Care Teams include your primary Cardiologist (physician) and Advanced Practice Providers (APPs -  Physician Assistants and Nurse Practitioners) who all work together to provide you with the care you need, when you need it.  We recommend signing up for the patient portal called "MyChart".  Sign up information is provided on this After Visit Summary.  MyChart is used to connect with patients for Virtual Visits (Telemedicine).  Patients are able to view lab/test results, encounter notes, upcoming appointments, etc.  Non-urgent messages can be sent to your provider as well.   To learn more about what you can do with MyChart, go to [email or web address].    Your next appointment:   4 month(s)  Provider:   [name], MD

## 2022-10-09 DIAGNOSIS — I493 Ventricular premature depolarization: Secondary | ICD-10-CM | POA: Diagnosis not present

## 2022-10-09 DIAGNOSIS — Z79899 Other long term (current) drug therapy: Secondary | ICD-10-CM | POA: Diagnosis not present

## 2022-10-30 DIAGNOSIS — I493 Ventricular premature depolarization: Secondary | ICD-10-CM | POA: Diagnosis not present

## 2022-10-30 DIAGNOSIS — Z79899 Other long term (current) drug therapy: Secondary | ICD-10-CM | POA: Diagnosis not present

## 2022-12-01 ENCOUNTER — Other Ambulatory Visit: Payer: Self-pay

## 2022-12-01 DIAGNOSIS — R001 Bradycardia, unspecified: Secondary | ICD-10-CM

## 2022-12-02 ENCOUNTER — Ambulatory Visit: Payer: Medicare Other | Attending: Cardiology | Admitting: Cardiology

## 2022-12-02 ENCOUNTER — Encounter: Payer: Self-pay | Admitting: Cardiology

## 2022-12-02 VITALS — BP 122/70 | HR 57 | Ht 70.0 in | Wt 170.4 lb

## 2022-12-02 DIAGNOSIS — R002 Palpitations: Secondary | ICD-10-CM | POA: Diagnosis not present

## 2022-12-02 DIAGNOSIS — R001 Bradycardia, unspecified: Secondary | ICD-10-CM | POA: Insufficient documentation

## 2022-12-02 DIAGNOSIS — I4719 Other supraventricular tachycardia: Secondary | ICD-10-CM | POA: Insufficient documentation

## 2022-12-02 DIAGNOSIS — I493 Ventricular premature depolarization: Secondary | ICD-10-CM | POA: Diagnosis not present

## 2022-12-02 NOTE — Patient Instructions (Signed)
Medication Instructions:  Your physician recommends that you continue on your current medications as directed. Please refer to the Current Medication list given to you today. *If you need a refill on your cardiac medications before your next appointment, please call your pharmacy*  Follow-Up: At [facility], you and your health needs are our priority.  As part of our continuing mission to provide you with exceptional heart care, we have created designated Provider Care Teams.  These Care Teams include your primary Cardiologist (physician) and Advanced Practice Providers (APPs -  Physician Assistants and Nurse Practitioners) who all work together to provide you with the care you need, when you need it.  We recommend signing up for the patient portal called "MyChart".  Sign up information is provided on this After Visit Summary.  MyChart is used to connect with patients for Virtual Visits (Telemedicine).  Patients are able to view lab/test results, encounter notes, upcoming appointments, etc.  Non-urgent messages can be sent to your provider as well.   To learn more about what you can do with MyChart, go to [email or web address].    Your next appointment:   As needed   Provider:   Allegra Lai, MD
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
## 2022-12-18 IMAGING — CT CT ABD-PELV W/ CM
2 of 5 series · 15 of 46 positions shown, 17 images · IV contrast (APPLIED)
Comparison: None.

CLINICAL DATA: Right lower quadrant abdominal pain.

EXAM:
CT ABDOMEN AND PELVIS WITH CONTRAST
TECHNIQUE: Multidetector CT imaging of the abdomen and pelvis was performed
using the standard protocol following bolus administration of
intravenous contrast.

[Series 3: abd pel w · axial · 0.73mm/px · z∈[+846,+1272]mm · 12 of 96 slices shown, 14 images]
[im 6/96  soft-tissue]
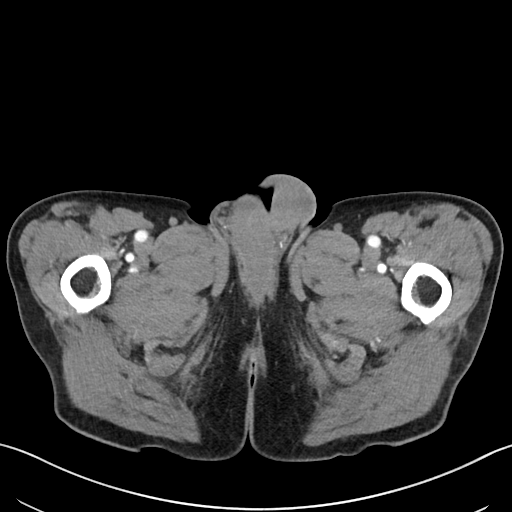
[im 6/96  bone]
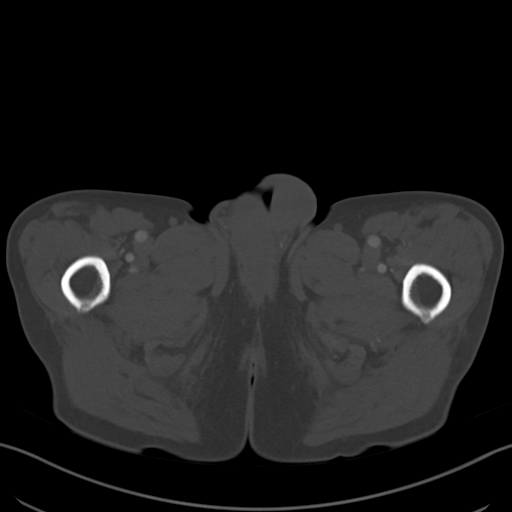
[im 16/96  soft-tissue]
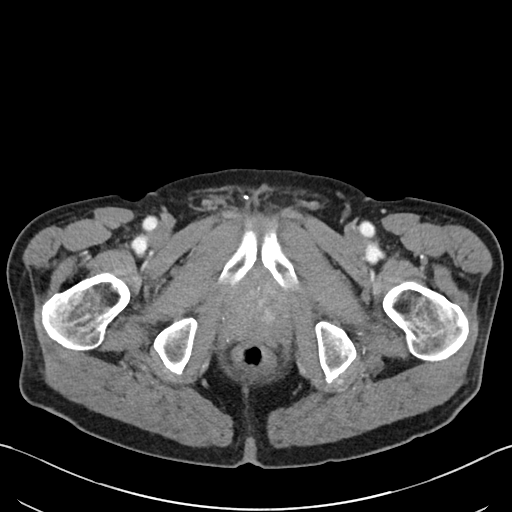
[im 21/96  soft-tissue]
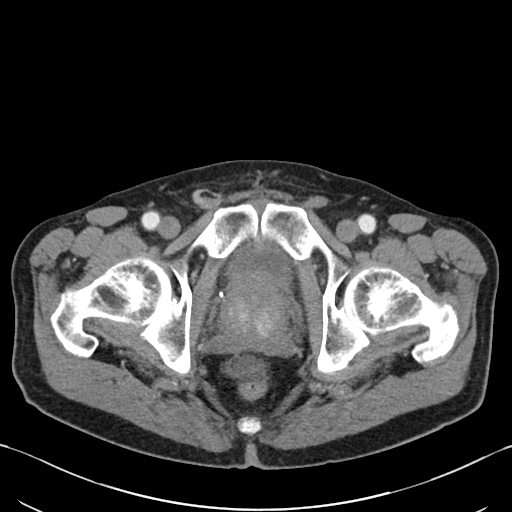
[im 31/96  soft-tissue]
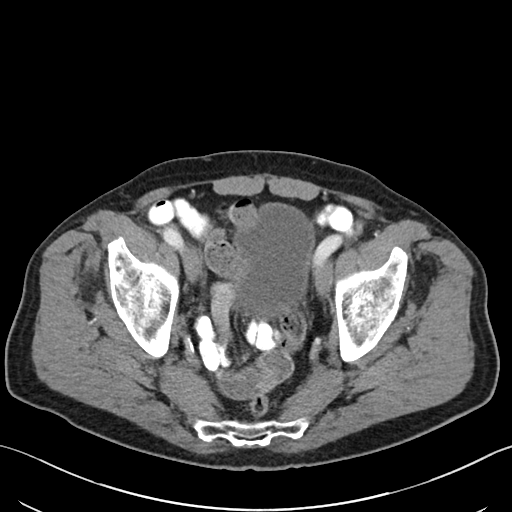
[im 36/96  soft-tissue]
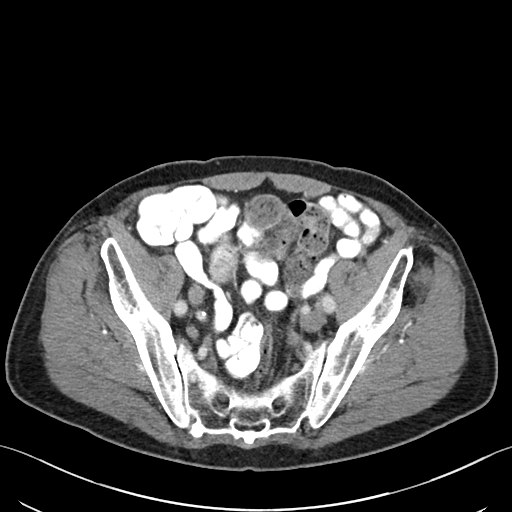
[im 46/96  soft-tissue]
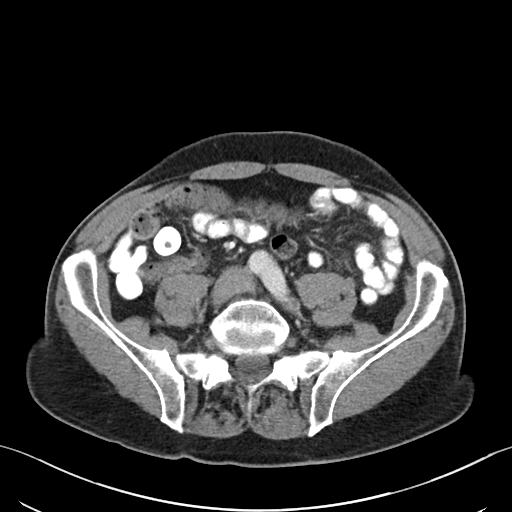
[im 51/96  soft-tissue]
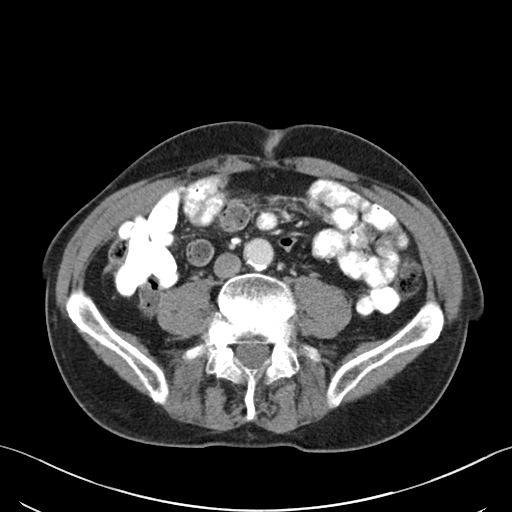
[im 61/96  soft-tissue]
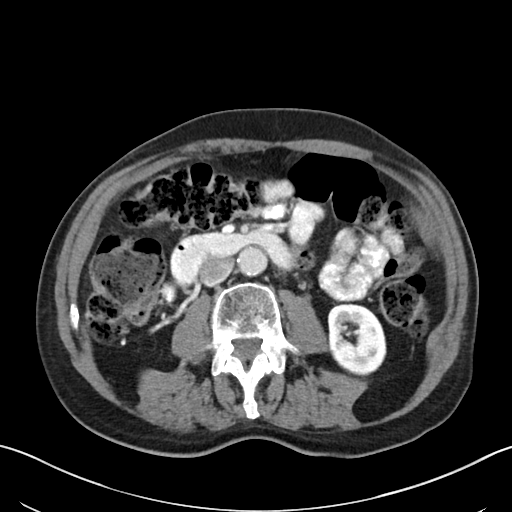
[im 66/96  soft-tissue]
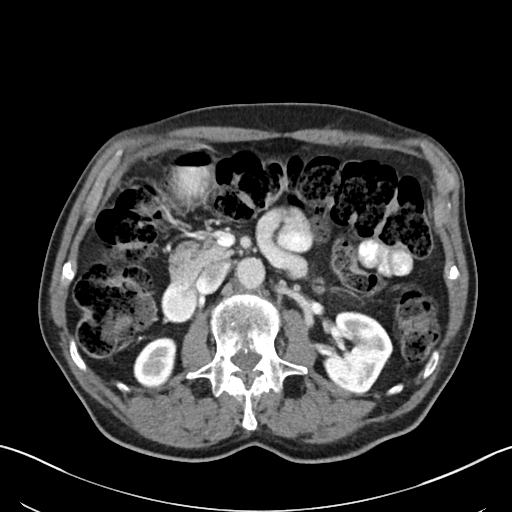
[im 66/96  bone]
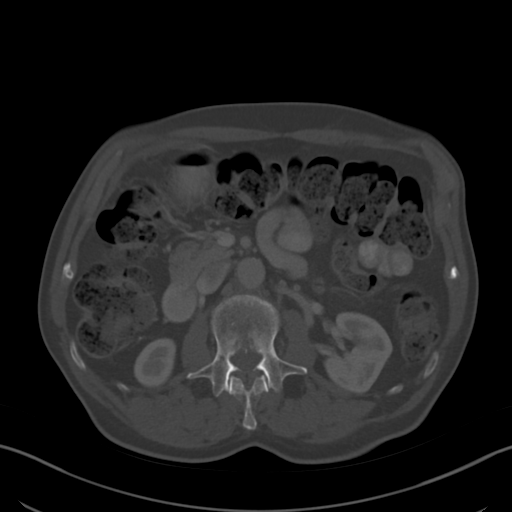
[im 76/96  soft-tissue]
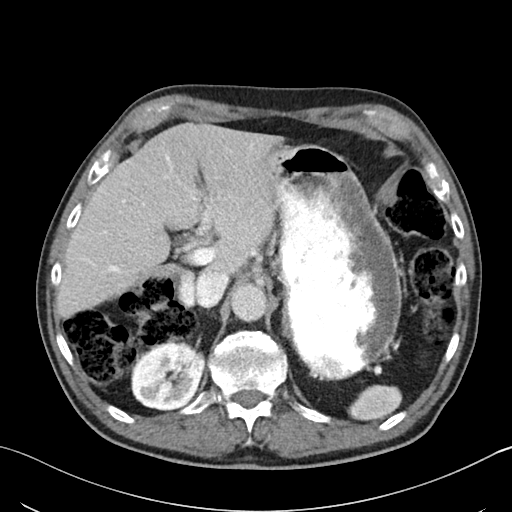
[im 81/96  soft-tissue]
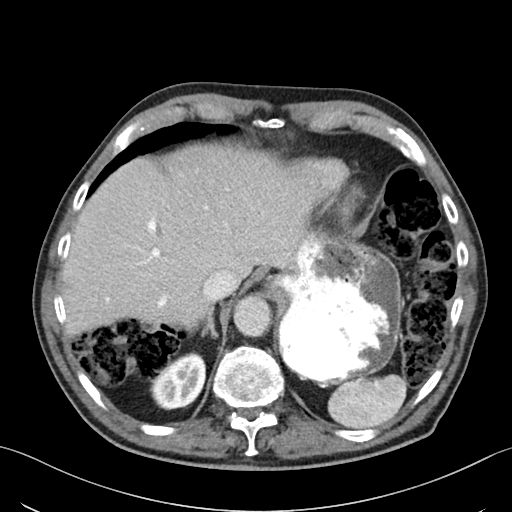
[im 91/96  soft-tissue]
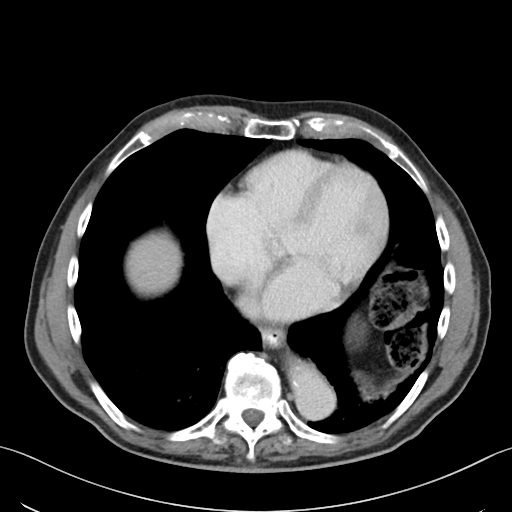

[Series 6: coronal · coronal · 0.72mm/px · 3 of 99 slices shown]
[im 33/99  soft-tissue]
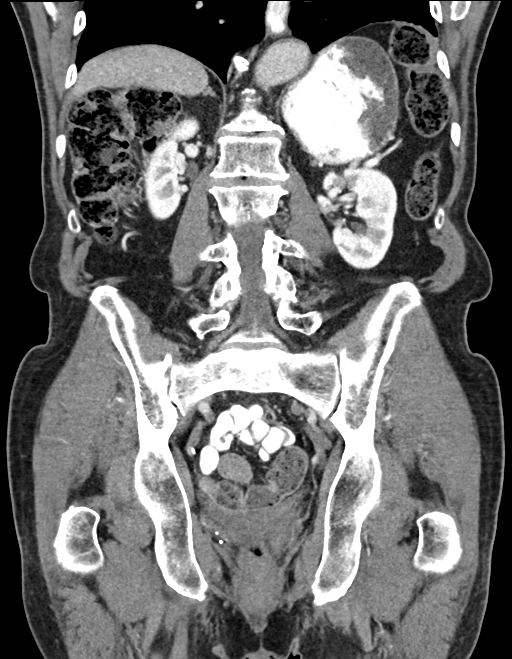
[im 44/99  soft-tissue]
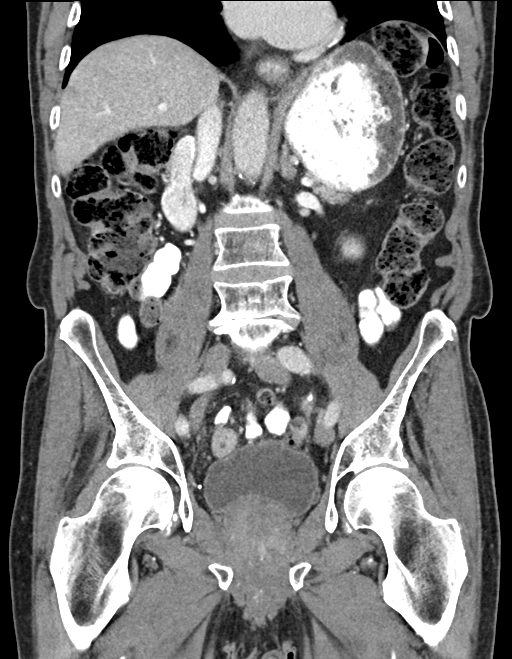
[im 55/99  soft-tissue]
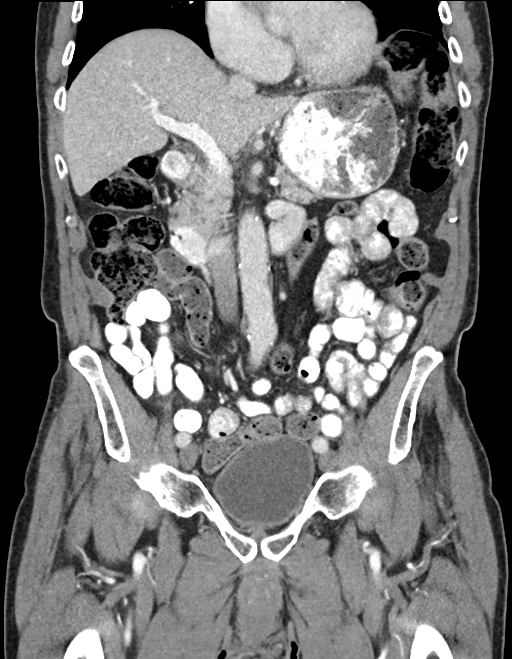

[15 of 46 positions shown; findings below may reference images not displayed]

RADIATION DOSE REDUCTION: This exam was performed according to the
departmental dose-optimization program which includes automated
exposure control, adjustment of the mA and/or kV according to
patient size and/or use of iterative reconstruction technique.

CONTRAST:  80mL OMNIPAQUE IOHEXOL 300 MG/ML  SOLN
FINDINGS: Lower chest: Tortuosity and calcific atherosclerotic disease of the
aorta. Enlarged cardiac silhouette. Calcifications of the aortic
valve. Atelectasis versus ground-glass airspace consolidation the
left lung base.

Hepatobiliary: No focal liver abnormality is seen. No gallstones,
gallbladder wall thickening, or biliary dilatation.

Pancreas: Unremarkable. No pancreatic ductal dilatation or
surrounding inflammatory changes.

Spleen: Normal in size without focal abnormality.

Adrenals/Urinary Tract: Adrenal glands are unremarkable. Kidneys are
normal, without renal calculi, focal lesion, or hydronephrosis.
Bladder is unremarkable.

Stomach/Bowel: Stomach is within normal limits. Appendix appears
normal. No evidence of bowel wall thickening, distention, or
inflammatory changes. Fecalization of contents of the distal small
bowel, evidence of stasis.

Vascular/Lymphatic: Aortic atherosclerosis. No enlarged abdominal or
pelvic lymph nodes.

Reproductive: Enlarged prostate gland measuring 5.2 cm.

Other: No abdominal wall hernia or abnormality. No abdominopelvic
ascites.

Musculoskeletal: L4-L5 bilateral pars articularis defect with 1 cm
anterolisthesis of L4 on L5 and secondary osteoarthritic changes.
IMPRESSION: 1. No evidence of acute abnormalities within the abdomen or pelvis.
2. Fecalization of contents of the distal small bowel, evidence of
stasis.
3. Enlarged prostate gland. Please correlate to patient's PSA
values.
4. Atelectasis versus ground-glass airspace consolidation the left
lung base.
5. L4-L5 bilateral pars articularis defect with 1 cm anterolisthesis
of L4 on L5 and secondary osteoarthritic changes.

Aortic Atherosclerosis (C4V2R-Y30.0).

## 2022-12-22 DIAGNOSIS — G4733 Obstructive sleep apnea (adult) (pediatric): Secondary | ICD-10-CM | POA: Diagnosis not present

## 2022-12-31 DIAGNOSIS — M4692 Unspecified inflammatory spondylopathy, cervical region: Secondary | ICD-10-CM | POA: Diagnosis not present

## 2023-02-06 ENCOUNTER — Ambulatory Visit: Payer: Medicare Other | Attending: Cardiology | Admitting: Cardiology

## 2023-02-06 ENCOUNTER — Encounter: Payer: Self-pay | Admitting: Cardiology

## 2023-02-06 VITALS — BP 122/56 | HR 49 | Ht 70.0 in | Wt 169.2 lb

## 2023-02-06 DIAGNOSIS — I2510 Atherosclerotic heart disease of native coronary artery without angina pectoris: Secondary | ICD-10-CM | POA: Diagnosis present

## 2023-02-06 DIAGNOSIS — M79605 Pain in left leg: Secondary | ICD-10-CM | POA: Insufficient documentation

## 2023-02-06 DIAGNOSIS — I5022 Chronic systolic (congestive) heart failure: Secondary | ICD-10-CM | POA: Diagnosis present

## 2023-02-06 DIAGNOSIS — I340 Nonrheumatic mitral (valve) insufficiency: Secondary | ICD-10-CM | POA: Diagnosis not present

## 2023-02-06 DIAGNOSIS — I459 Conduction disorder, unspecified: Secondary | ICD-10-CM | POA: Insufficient documentation

## 2023-02-06 DIAGNOSIS — I493 Ventricular premature depolarization: Secondary | ICD-10-CM | POA: Insufficient documentation

## 2023-02-06 DIAGNOSIS — M79604 Pain in right leg: Secondary | ICD-10-CM | POA: Diagnosis present

## 2023-02-06 DIAGNOSIS — I4719 Other supraventricular tachycardia: Secondary | ICD-10-CM | POA: Diagnosis not present

## 2023-02-06 NOTE — Patient Instructions (Signed)
 Medication Instructions:  Continue current medications *If you need a refill on your cardiac medications before your next appointment, please call your pharmacy*   Lab Work: Cmet today If you have labs (blood work) drawn today and your tests are completely normal, you will receive your results only by: MyChart Message (if you have MyChart) OR A paper copy in the mail If you have any lab test that is abnormal or we need to change your treatment, we will call you to review the results.   Testing/Procedures: Your physician has requested that you have an ankle brachial index (ABI). During this test an ultrasound and blood pressure cuff are used to evaluate the arteries that supply the arms and legs with blood. Allow thirty minutes for this exam. There are no restrictions or special instructions.  Please note: We ask at that you not bring children with you during ultrasound (echo/ vascular) testing. Due to room size and safety concerns, children are not allowed in the ultrasound rooms during exams. Our front office staff cannot provide observation of children in our lobby area while testing is being conducted. An adult accompanying a patient to their appointment will only be allowed in the ultrasound room at the discretion of the ultrasound technician under special circumstances. We apologize for any inconvenience.    Follow-Up: At [facility], you and your health needs are our priority.  As part of our continuing mission to provide you with exceptional heart care, we have created designated Provider Care Teams.  These Care Teams include your primary Cardiologist (physician) and Advanced Practice Providers (APPs -  Physician Assistants and Nurse Practitioners) who all work together to provide you with the care you need, when you need it.  We recommend signing up for the patient portal called MyChart.  Sign up information is provided on this After Visit Summary.  MyChart is used to  connect with patients for Virtual Visits (Telemedicine).  Patients are able to view lab/test results, encounter notes, upcoming appointments, etc.  Non-urgent messages can be sent to your provider as well.   To learn more about what you can do with MyChart, go to [email or web address].    Your next appointment:   6 month(s)  Provider:   Dr. Kate  Other Instructions none
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
## 2023-02-07 LAB — COMPREHENSIVE METABOLIC PANEL
ALT: 13 [IU]/L (ref 0–44)
AST: 17 [IU]/L (ref 0–40)
Albumin: 4 g/dL (ref 3.7–4.7)
Alkaline Phosphatase: 56 [IU]/L (ref 44–121)
BUN/Creatinine Ratio: 21 (ref 10–24)
BUN: 26 mg/dL (ref 8–27)
Bilirubin Total: 0.6 mg/dL (ref 0.0–1.2)
CO2: 28 mmol/L (ref 20–29)
Calcium: 9.4 mg/dL (ref 8.6–10.2)
Chloride: 100 mmol/L (ref 96–106)
Creatinine, Ser: 1.25 mg/dL (ref 0.76–1.27)
Globulin, Total: 2.7 g/dL (ref 1.5–4.5)
Glucose: 103 mg/dL — ABNORMAL HIGH (ref 70–99)
Potassium: 4.8 mmol/L (ref 3.5–5.2)
Sodium: 141 mmol/L (ref 134–144)
Total Protein: 6.7 g/dL (ref 6.0–8.5)
eGFR: 55 mL/min/{1.73_m2} — ABNORMAL LOW (ref >59)

## 2023-02-09 DIAGNOSIS — M545 Low back pain, unspecified: Secondary | ICD-10-CM | POA: Diagnosis not present

## 2023-02-18 ENCOUNTER — Encounter: Payer: Self-pay | Admitting: *Deleted

## 2023-02-26 ENCOUNTER — Encounter (HOSPITAL_COMMUNITY): Payer: Self-pay

## 2023-02-26 ENCOUNTER — Ambulatory Visit (HOSPITAL_COMMUNITY)
Admission: RE | Admit: 2023-02-26 | Payer: Medicare Other | Source: Ambulatory Visit | Attending: Cardiology | Admitting: Cardiology

## 2023-02-27 ENCOUNTER — Ambulatory Visit (HOSPITAL_COMMUNITY)
Admission: RE | Admit: 2023-02-27 | Discharge: 2023-02-27 | Disposition: A | Discharge status: Home / Self Care | Payer: Medicare Other | Source: Ambulatory Visit | Attending: Cardiology | Admitting: Cardiology

## 2023-02-27 DIAGNOSIS — M79604 Pain in right leg: Secondary | ICD-10-CM | POA: Insufficient documentation

## 2023-02-27 DIAGNOSIS — M79605 Pain in left leg: Secondary | ICD-10-CM | POA: Insufficient documentation

## 2023-03-01 LAB — VAS US ABI WITH/WO TBI
Left ABI: 1.18
Right ABI: 1.26

## 2023-03-02 ENCOUNTER — Encounter: Payer: Self-pay | Admitting: *Deleted

## 2023-03-10 DIAGNOSIS — G4733 Obstructive sleep apnea (adult) (pediatric): Secondary | ICD-10-CM | POA: Diagnosis not present

## 2023-04-09 DIAGNOSIS — I2510 Atherosclerotic heart disease of native coronary artery without angina pectoris: Secondary | ICD-10-CM | POA: Diagnosis not present

## 2023-04-09 DIAGNOSIS — R739 Hyperglycemia, unspecified: Secondary | ICD-10-CM | POA: Diagnosis not present

## 2023-04-09 DIAGNOSIS — E785 Hyperlipidemia, unspecified: Secondary | ICD-10-CM | POA: Diagnosis not present

## 2023-04-09 DIAGNOSIS — Z125 Encounter for screening for malignant neoplasm of prostate: Secondary | ICD-10-CM | POA: Diagnosis not present

## 2023-04-16 DIAGNOSIS — R972 Elevated prostate specific antigen [PSA]: Secondary | ICD-10-CM | POA: Diagnosis not present

## 2023-04-16 DIAGNOSIS — Z0000 Encounter for general adult medical examination without abnormal findings: Secondary | ICD-10-CM | POA: Diagnosis not present

## 2023-04-16 DIAGNOSIS — I340 Nonrheumatic mitral (valve) insufficiency: Secondary | ICD-10-CM | POA: Diagnosis not present

## 2023-04-16 DIAGNOSIS — H903 Sensorineural hearing loss, bilateral: Secondary | ICD-10-CM | POA: Diagnosis not present

## 2023-04-16 DIAGNOSIS — I2510 Atherosclerotic heart disease of native coronary artery without angina pectoris: Secondary | ICD-10-CM | POA: Diagnosis not present

## 2023-04-16 DIAGNOSIS — E785 Hyperlipidemia, unspecified: Secondary | ICD-10-CM | POA: Diagnosis not present

## 2023-04-16 DIAGNOSIS — M47812 Spondylosis without myelopathy or radiculopathy, cervical region: Secondary | ICD-10-CM | POA: Diagnosis not present

## 2023-04-16 DIAGNOSIS — J300 Vasomotor rhinitis: Secondary | ICD-10-CM | POA: Diagnosis not present

## 2023-04-16 DIAGNOSIS — R82998 Other abnormal findings in urine: Secondary | ICD-10-CM | POA: Diagnosis not present

## 2023-04-16 DIAGNOSIS — I5042 Chronic combined systolic (congestive) and diastolic (congestive) heart failure: Secondary | ICD-10-CM | POA: Diagnosis not present

## 2023-05-28 DIAGNOSIS — D1801 Hemangioma of skin and subcutaneous tissue: Secondary | ICD-10-CM | POA: Diagnosis not present

## 2023-05-28 DIAGNOSIS — L821 Other seborrheic keratosis: Secondary | ICD-10-CM | POA: Diagnosis not present

## 2023-05-28 DIAGNOSIS — D2271 Melanocytic nevi of right lower limb, including hip: Secondary | ICD-10-CM | POA: Diagnosis not present

## 2023-05-28 DIAGNOSIS — L218 Other seborrheic dermatitis: Secondary | ICD-10-CM | POA: Diagnosis not present

## 2023-06-18 ENCOUNTER — Encounter: Payer: Self-pay | Admitting: Cardiology

## 2023-07-04 NOTE — Progress Notes (Unsigned)
  Electrophysiology Office Note:   Date:  07/06/2023  ID:  Jaime Gates, DOB Mar 29, 1931, MRN [id number]  Primary Cardiologist: Wendie Hamburg, MD Primary Heart Failure: None Electrophysiologist: Esra Frankowski Cortland Ding, MD      History of Present Illness:   Jaime Gates is a 89 y.o. male with h/o atrial tachycardia,, atrial fibrillation hyperlipidemia seen today for routine electrophysiology followup.   Since last being seen in our clinic the patient reports doing overall well.  He has not had any syncope.  He continues to be able to do his daily activities, working in the garden.  He does have some fatigue throughout the day, at times falling asleep when he is sitting in his desk doing work.  He is also noted to have muscle weakness, having trouble standing up out of a low chair.  He has no other acute complaints.  he denies chest pain, palpitations, dyspnea, PND, orthopnea, nausea, vomiting, dizziness, syncope, edema, weight gain, or early satiety.   Review of systems complete and found to be negative unless listed in HPI.   EP Information / Studies Reviewed:    EKG is ordered today. Personal review as below.  EKG Interpretation Date/Time:  Monday July 06 2023 08:46:42 EDT Ventricular Rate:  64 PR Interval:  200 QRS Duration:  114 QT Interval:  492 QTC Calculation: 507 R Axis:   12  Text Interpretation: Sinus rhythm with Premature atrial complexes Incomplete left bundle branch block Nonspecific T wave abnormality Prolonged QT When compared with ECG of 06-Feb-2023 09:25, QT has lengthened Confirmed by [employer], Akita Maxim ([id number]) on 07/06/2023 8:49:42 AM     Risk Assessment/Calculations:           Physical Exam:   VS:  BP 120/70 (BP Location: Left Arm, Patient Position: Sitting, Cuff Size: Normal)   Pulse 64   Ht 5\' 10"  (1.778 m)   Wt 165 lb (74.8 kg)   SpO2 95%   BMI 23.68 kg/m    Wt Readings from Last 3 Encounters:  07/06/23 165 lb (74.8 kg)  02/06/23 169 lb 3.2 oz  (76.7 kg)  12/02/22 170 lb 6.4 oz (77.3 kg)     GEN: Well nourished, well developed in no acute distress NECK: No JVD; No carotid bruits CARDIAC: Regular rate and rhythm, no murmurs, rubs, gallops RESPIRATORY:  Clear to auscultation without rales, wheezing or rhonchi  ABDOMEN: Soft, non-tender, non-distended EXTREMITIES:  No edema; No deformity   ASSESSMENT AND PLAN:    1.  Intermittent heart block: Occurred on cardiac monitoring.  He has nonconducted P waves intermittently.  These episodes occurred before 7:30 AM.  Has not had syncope.  Ahjanae Cassel continue to monitor.  2.  PVCs: 4.7% burden.  Minimal symptoms.  Continue monitoring.  3.  Nonobstructive coronary artery disease: No current chest pain.  Plan per primary cardiology.  4.  Mitral regurgitation: Plan per primary cardiology  Follow up with Dr. Lawana Pray PRN   Signed, Asharia Lotter Cortland Ding, MD

## 2023-07-06 ENCOUNTER — Ambulatory Visit: Attending: Cardiology | Admitting: Cardiology

## 2023-07-06 ENCOUNTER — Encounter: Payer: Self-pay | Admitting: Cardiology

## 2023-07-06 VITALS — BP 120/70 | HR 64 | Ht 70.0 in | Wt 165.0 lb

## 2023-07-06 DIAGNOSIS — I493 Ventricular premature depolarization: Secondary | ICD-10-CM | POA: Diagnosis not present

## 2023-07-06 DIAGNOSIS — R001 Bradycardia, unspecified: Secondary | ICD-10-CM

## 2023-07-06 DIAGNOSIS — R002 Palpitations: Secondary | ICD-10-CM | POA: Diagnosis not present

## 2023-07-06 DIAGNOSIS — I4719 Other supraventricular tachycardia: Secondary | ICD-10-CM | POA: Diagnosis not present

## 2023-07-06 DIAGNOSIS — I459 Conduction disorder, unspecified: Secondary | ICD-10-CM | POA: Diagnosis not present

## 2023-07-06 NOTE — Patient Instructions (Signed)
 Medication Instructions:  Your physician recommends that you continue on your current medications as directed. Please refer to the Current Medication list given to you today.  *If you need a refill on your cardiac medications before your next appointment, please call your pharmacy*  Lab Work: None ordered   Testing/Procedures: None ordered  Follow-Up: At [facility], you and your health needs are our priority.  As part of our continuing mission to provide you with exceptional heart care, our providers are all part of one team.  This team includes your primary Cardiologist (physician) and Advanced Practice Providers or APPs (Physician Assistants and Nurse Practitioners) who all work together to provide you with the care you need, when you need it.  Your next appointment:   as needed  Provider:   Agatha Horsfall, MD     Thank you for choosing [facility]!!   Jaime Cane, RN [phone]

## 2023-09-21 ENCOUNTER — Other Ambulatory Visit: Payer: Self-pay | Admitting: Cardiology

## 2023-09-23 ENCOUNTER — Other Ambulatory Visit: Payer: Self-pay

## 2023-09-23 MED ORDER — ATORVASTATIN CALCIUM 10 MG PO TABS: 10.0000 mg | ORAL_TABLET | Freq: Every day | ORAL | 3 refills | Status: AC

## 2023-09-23 NOTE — Telephone Encounter (Signed)
 [facility] is requesting clarification for medication Atorvastatin  10 mg table. Pharmacy stated that pt is allergy to Pravastatin and would like Dr. Kate to evaluate if atorvastatin  would be appropriate to dispense. Is Dr. Kate aware of the allergy to pravastatin and would like for pt to continue taking atorvastatin ? Please address   Inv# [id number]  Ph#  [phone] Please address

## 2023-09-29 ENCOUNTER — Telehealth: Payer: Self-pay | Admitting: *Deleted

## 2023-09-29 NOTE — Telephone Encounter (Signed)
 Called patient to make him and DPR,  Almarie aware that Atorvastatin  has been faxed to [phone]

## 2023-10-01 ENCOUNTER — Encounter: Payer: Self-pay | Admitting: Cardiology

## 2023-10-01 ENCOUNTER — Other Ambulatory Visit: Payer: Self-pay

## 2023-10-01 NOTE — Telephone Encounter (Signed)
 RX sent in

## 2023-12-21 DIAGNOSIS — G4733 Obstructive sleep apnea (adult) (pediatric): Secondary | ICD-10-CM | POA: Diagnosis not present
